# Patient Record
Sex: Female | Born: 1980 | Hispanic: Yes | Marital: Married | Smoking: Never smoker
Health system: Southern US, Community
[De-identification: ages and names within clinical notes are randomized; demographics above are authoritative.]

## PROBLEM LIST (undated history)

## (undated) ENCOUNTER — Inpatient Hospital Stay (HOSPITAL_COMMUNITY): Payer: Self-pay

## (undated) DIAGNOSIS — R112 Nausea with vomiting, unspecified: Secondary | ICD-10-CM

## (undated) DIAGNOSIS — N806 Endometriosis in cutaneous scar: Secondary | ICD-10-CM

## (undated) DIAGNOSIS — Z9889 Other specified postprocedural states: Secondary | ICD-10-CM

## (undated) HISTORY — DX: Endometriosis in cutaneous scar: N80.6

---

## 2007-07-21 ENCOUNTER — Inpatient Hospital Stay (HOSPITAL_COMMUNITY): Admission: AD | Admit: 2007-07-21 | Discharge: 2007-07-26 | Payer: Self-pay | Admitting: Obstetrics & Gynecology

## 2007-07-23 ENCOUNTER — Encounter: Payer: Self-pay | Admitting: Obstetrics & Gynecology

## 2008-06-04 ENCOUNTER — Emergency Department (HOSPITAL_COMMUNITY): Admission: EM | Admit: 2008-06-04 | Discharge: 2008-06-04 | Payer: Self-pay | Admitting: Emergency Medicine

## 2010-09-22 ENCOUNTER — Other Ambulatory Visit (HOSPITAL_COMMUNITY): Payer: Self-pay | Admitting: Obstetrics

## 2010-09-22 DIAGNOSIS — Z362 Encounter for other antenatal screening follow-up: Secondary | ICD-10-CM

## 2010-09-29 ENCOUNTER — Encounter (HOSPITAL_COMMUNITY)
Admission: RE | Admit: 2010-09-29 | Discharge: 2010-09-29 | Disposition: A | Discharge status: Home / Self Care | Payer: Self-pay | Source: Ambulatory Visit | Attending: Obstetrics | Admitting: Obstetrics

## 2010-09-29 DIAGNOSIS — Z01812 Encounter for preprocedural laboratory examination: Secondary | ICD-10-CM | POA: Insufficient documentation

## 2010-09-29 LAB — CBC
HCT: 35.7 % — ABNORMAL LOW (ref 36.0–46.0)
Hemoglobin: 11.8 g/dL — ABNORMAL LOW (ref 12.0–15.0)
MCH: 30.3 pg (ref 26.0–34.0)
MCHC: 33.1 g/dL (ref 30.0–36.0)
MCV: 91.5 fL (ref 78.0–100.0)
RBC: 3.9 MIL/uL (ref 3.87–5.11)

## 2010-09-29 LAB — SURGICAL PCR SCREEN: MRSA, PCR: NEGATIVE

## 2010-09-29 LAB — RPR: RPR Ser Ql: NONREACTIVE

## 2010-09-30 ENCOUNTER — Other Ambulatory Visit (HOSPITAL_COMMUNITY): Payer: Self-pay

## 2010-09-30 ENCOUNTER — Ambulatory Visit (HOSPITAL_COMMUNITY)
Admission: RE | Admit: 2010-09-30 | Discharge: 2010-09-30 | Disposition: A | Discharge status: Home / Self Care | Payer: Self-pay | Source: Ambulatory Visit | Attending: Obstetrics | Admitting: Obstetrics

## 2010-09-30 ENCOUNTER — Ambulatory Visit (HOSPITAL_COMMUNITY)
Admission: RE | Admit: 2010-09-30 | Discharge: 2010-09-30 | Disposition: A | Discharge status: Home / Self Care | Payer: Medicaid Other | Source: Ambulatory Visit | Attending: Obstetrics | Admitting: Obstetrics

## 2010-09-30 DIAGNOSIS — Z8751 Personal history of pre-term labor: Secondary | ICD-10-CM | POA: Insufficient documentation

## 2010-09-30 DIAGNOSIS — Z362 Encounter for other antenatal screening follow-up: Secondary | ICD-10-CM

## 2010-09-30 DIAGNOSIS — O34219 Maternal care for unspecified type scar from previous cesarean delivery: Secondary | ICD-10-CM | POA: Insufficient documentation

## 2010-10-01 ENCOUNTER — Other Ambulatory Visit (HOSPITAL_COMMUNITY): Payer: Self-pay | Admitting: Obstetrics

## 2010-10-01 DIAGNOSIS — O34219 Maternal care for unspecified type scar from previous cesarean delivery: Secondary | ICD-10-CM

## 2010-10-01 DIAGNOSIS — Z8751 Personal history of pre-term labor: Secondary | ICD-10-CM

## 2010-10-07 ENCOUNTER — Inpatient Hospital Stay (HOSPITAL_COMMUNITY)
Admission: RE | Admit: 2010-10-07 | Discharge: 2010-10-10 | DRG: 766 | Disposition: A | Discharge status: Home / Self Care | Payer: Medicaid Other | Source: Ambulatory Visit | Attending: Obstetrics | Admitting: Obstetrics

## 2010-10-07 ENCOUNTER — Ambulatory Visit (HOSPITAL_COMMUNITY): Payer: Self-pay

## 2010-10-07 ENCOUNTER — Ambulatory Visit (HOSPITAL_COMMUNITY)
Admission: RE | Admit: 2010-10-07 | Discharge: 2010-10-07 | Disposition: A | Discharge status: Home / Self Care | Payer: Medicaid Other | Source: Ambulatory Visit | Attending: Obstetrics | Admitting: Obstetrics

## 2010-10-07 ENCOUNTER — Ambulatory Visit (HOSPITAL_COMMUNITY): Admission: RE | Admit: 2010-10-07 | Payer: Medicaid Other | Source: Ambulatory Visit

## 2010-10-07 DIAGNOSIS — O479 False labor, unspecified: Secondary | ICD-10-CM | POA: Insufficient documentation

## 2010-10-07 DIAGNOSIS — O34219 Maternal care for unspecified type scar from previous cesarean delivery: Secondary | ICD-10-CM

## 2010-10-07 DIAGNOSIS — Z8751 Personal history of pre-term labor: Secondary | ICD-10-CM

## 2010-10-07 LAB — CBC
HCT: 37.9 % (ref 36.0–46.0)
MCH: 30.8 pg (ref 26.0–34.0)
MCHC: 33.8 g/dL (ref 30.0–36.0)
MCV: 91.1 fL (ref 78.0–100.0)
Platelets: 121 10*3/uL — ABNORMAL LOW (ref 150–400)
RBC: 4.16 MIL/uL (ref 3.87–5.11)
RDW: 13.9 % (ref 11.5–15.5)
WBC: 8.3 10*3/uL (ref 4.0–10.5)

## 2010-10-07 LAB — TYPE AND SCREEN

## 2010-10-08 LAB — CBC
MCH: 30.3 pg (ref 26.0–34.0)
MCHC: 33.1 g/dL (ref 30.0–36.0)
RBC: 3.33 MIL/uL — ABNORMAL LOW (ref 3.87–5.11)
RDW: 13.9 % (ref 11.5–15.5)

## 2010-10-13 ENCOUNTER — Other Ambulatory Visit (HOSPITAL_COMMUNITY): Payer: Self-pay

## 2010-11-03 ENCOUNTER — Inpatient Hospital Stay (HOSPITAL_COMMUNITY)
Admission: AD | Admit: 2010-11-03 | Discharge: 2010-11-03 | Disposition: A | Discharge status: Home / Self Care | Payer: Medicaid Other | Source: Ambulatory Visit | Attending: Obstetrics | Admitting: Obstetrics

## 2010-11-03 DIAGNOSIS — O909 Complication of the puerperium, unspecified: Secondary | ICD-10-CM | POA: Insufficient documentation

## 2010-11-04 NOTE — Op Note (Addendum)
  NAMESHARLEEN, [name]        ACCOUNT NO.:  [id number]  MEDICAL RECORD NO.:  [id number]           PATIENT TYPE:  O  LOCATION:  MFM                           F[facility]  [facility]  PHYSICIAN:  Kathreen Cosier, M.D.DATE OF BIRTH:  09-03-1980  DATE OF PROCEDURE:  10/07/2010 DATE OF DISCHARGE:                              OPERATIVE REPORT   PREOPERATIVE DIAGNOSIS:  Previous classical cesarean section at 29 weeks, now at 38 plus weeks in early labor for repeat cesarean section.  POSTOPERATIVE DIAGNOSIS:  Previous classical cesarean section at 29 weeks, now at 38 plus weeks in early labor for repeat cesarean section.  SURGEON:  Kathreen Cosier, MD  FIRST ASSISTANT:  Charles A. Clearance Coots, MD  DESCRIPTION OF PROCEDURE:  The patient placed on the operating table in supine position after spinal administered.  Abdomen prepped and draped. Bladder emptied with a Foley catheter.  Transverse suprapubic incision was made, carried down to the rectus fascia.  Fascia cleanly incised to the length of the incision.  Recti muscles retracted laterally. Peritoneum incised longitudinally.  It was noted she had a T incision on her uterus and the uterine wall was very thin.  A transverse lower uterine incision made.  Fluid was clear.  The patient delivered from the LOA position of a female, Apgars 7 and 9, weighing 6 pounds and 12 ounces.  Placenta was posterior, removed manually, and sent to Labor and Delivery.  Uterine cavity cleaned with dry laps.  Uterine incision closed in 1 layer with continuous suture of #1 chromic.  Hemostasis satisfactory.  Bladder flap reattached with 2-0 chromic.  Uterus well contracted.  Tubes and ovaries normal.  Abdomen closed in layers, peritoneum with continuous suture of 0 chromic, fascia with continuous suture of 0 Dexon, and the skin closed with subcuticular stitch of 4-0 Monocryl.  Blood loss 700 mL.  The patient tolerated procedure well, taken to recovery room in  good condition.          ______________________________ Kathreen Cosier, M.D.     BAM/MEDQ  D:  10/07/2010  T:  10/08/2010  Job:  [id number]  Electronically Signed by Francoise Ceo M.D. on 10/14/2010 08:27:08 AM

## 2010-11-04 NOTE — Discharge Summary (Signed)
  [name], [name]        ACCOUNT NO.:  [id number]  MEDICAL RECORD NO.:  [id number]           PATIENT TYPE:  I  LOCATION:  9146                          [facility]ACILITY:  [facility]  PHYSICIAN:  Roseanna Rainbow, M.D.DATE OF BIRTH:  [birth year]  DATE OF ADMISSION:  10/07/2010 DATE OF DISCHARGE:  10/10/2010                              DISCHARGE SUMMARY   CHIEF COMPLAINT:  The patient is a 30 year old gravida 2, para 0-1-0-1 with a history of previous classical cesarean delivery, for a repeat cesarean delivery.  HISTORY OF PRESENT ILLNESS:  The patient had presented to the maternal fetal medicine department for a scheduled amniocentesis.  She was found to be in early labor.  The recommendation was to proceed with a cesarean delivery.  PHYSICAL EXAMINATION:  HEAD, EYES, EARS, NOSE, AND THROAT: Normocephalic, atraumatic. NECK:  No masses, nontender. LUNGS:  Clear to auscultation bilaterally. HEART:  Regular rate and rhythm.  No murmurs, rubs, or gallops. ABDOMEN:  Term. EXTREMITIES:  No clubbing, cyanosis, or edema. PELVIC:  The cervix is long and closed.  ASSESSMENT:  Intrauterine pregnancy at 37 weeks, history of a previous classical cesarean delivery with threatened labor.  PLAN:  Admission, repeat cesarean delivery.  HOSPITAL COURSE:  The patient was admitted and underwent a repeat cesarean delivery.  Please see the dictated operative summary for findings.  On postoperative day #1, hemoglobin was 10.1.  The remainder of her hospital course was uneventful.  She received a Depo-Provera prior to discharge.  DISCHARGE DIAGNOSIS:  Intrauterine pregnancy at 37 weeks, history of previous classical cesarean delivery, threatened labor.  PROCEDURE:  Repeat cesarean delivery.  CONDITION:  Stable.  DIET:  Regular.  ACTIVITY:  Pelvic rest, progressive activity.  MEDICATIONS:  Percocet 5/325 one to two tablets every 6 hours as needed.  DISPOSITION:  The patient was to follow  up in the office with Dr. Gaynell Face in 2 weeks.     Roseanna Rainbow, M.D.     [name]  D:  10/30/2010  T:  10/31/2010  Job:  [id number]  cc:   Kathreen Cosier, M.D. Fax: [phone]  Electronically Signed by Antionette Char M.D. on 11/04/2010 09:52:05 PM

## 2010-11-06 LAB — WOUND CULTURE

## 2010-11-07 ENCOUNTER — Inpatient Hospital Stay (HOSPITAL_COMMUNITY)
Admission: AD | Admit: 2010-11-07 | Discharge: 2010-11-07 | Disposition: A | Discharge status: Home / Self Care | Payer: Medicaid Other | Source: Ambulatory Visit | Attending: Obstetrics | Admitting: Obstetrics

## 2010-11-07 DIAGNOSIS — O909 Complication of the puerperium, unspecified: Secondary | ICD-10-CM | POA: Insufficient documentation

## 2010-12-15 ENCOUNTER — Inpatient Hospital Stay (HOSPITAL_COMMUNITY): Admission: AD | Admit: 2010-12-15 | Payer: Self-pay | Source: Home / Self Care | Admitting: Obstetrics

## 2010-12-29 NOTE — Op Note (Signed)
[name], [name]                ACCOUNT NO.:  [id number]   MEDICAL RECORD NO.:  [id number]          PATIENT TYPE:  INP   LOCATION:  9149                          [facility]ILITY[facility]  [facility]   PHYSICIAN:  Roseanna Rainbow, M.D.DATE OF BIRTH:  1981-05-03   DATE OF PROCEDURE:  07/23/2007  DATE OF DISCHARGE:                               OPERATIVE REPORT   PREOPERATIVE DIAGNOSES:  1. Intrauterine pregnancy at 29+ weeks.  2. Rule out abruptio placentae.  3. Oligohydramnios.  4. Suspicious fetal heart tracing with late decelerations.  5. Biophysical profile 2/8.   POSTOPERATIVE DIAGNOSES:  1. Intrauterine pregnancy at 29+ weeks.  2. Rule out abruptio placentae.  3. Oligohydramnios.  4. Suspicious fetal heart tracing with late decelerations.  5. Biophysical profile 2/8.   PROCEDURE:  Primary classical cesarean delivery via Pfannenstiel skin  incision.   SURGEON:  Roseanna Rainbow, M.D.   ANESTHESIA:  Spinal.   PATHOLOGY:  Placenta.   ESTIMATED BLOOD LOSS:  600 mL.   FINDINGS:  The membranes were tinged with a yellow pigment.  The  membranes that were removed from the uterine wall after the placenta had  been removed were necrotic and again tinged with a yellow pigment.  An  umbilical artery pH was sent and was 7.13.   PROCEDURE:  The patient was taken to the operating room with an IV  running.  A spinal anesthetic was then administered.  She was then  placed in the dorsal supine position with a leftward tilt and prepped  and draped in the usual sterile fashion.  A Pfannenstiel skin incision  was then made with a scalpel and carried down to the underlying fascia.  The fascia was nicked in the midline.  The fascial incision was then  extended bilaterally.  The superior aspect of the fascial incision was  tented up and the underlying rectus muscles dissected off.  The inferior  aspect of the fascial incision was then manipulated in a similar  fashion.  The rectus muscles were  separated in the midline.  The  parietal peritoneum was tented up and entered sharply.  This incision  was then extended superiorly and inferiorly with good visualization of  the bladder.  The bladder blade was then placed.  The vesicouterine  peritoneum was tented up and entered sharply.  This incision was then  extended bilaterally and the bladder flap created bluntly.  At this  point, the lower uterine segment was not developed and the decision was  made to proceed with a midline vertical uterine incision.  This incision  was started in the lower uterine segment and extended superiorly to the  fundus.  The infant was then delivered en caul.  The cord was clamped  and cut.  The infant was handed over to the awaiting neonatologist.  An  umbilical artery pH was sent; please see the above.  The placenta was  then removed.  The intrauterine cavity was evacuated of any remaining  amniotic fluid, clots and debris with a moistened laparotomy sponge.  The uterus was exteriorized.  The uterine incision was then  reapproximated in 2 layers of running interlocking sutures of 0  Monocryl.  Seprafilm was then placed over the over the uterine closure.  The abdomen was irrigated.  The uterus was returned to the abdomen.  The  parietal peritoneum was reapproximated in a running fashion using 2-0  Vicryl.  The fascia was closed in a running fashion using 0 Vicryl.  The  skin was closed with staples.  At the closure of the procedure, the  instrument and pack counts were said to be correct x2.  The patient was  taken to the PACU, awake and in stable condition.  A gram of cephazolin  had been given at cord clamp.      Roseanna Rainbow, M.D.  Electronically Signed     LAJ/MEDQ  D:  07/23/2007  T:  07/24/2007  Job:  [id number]

## 2011-01-01 NOTE — Discharge Summary (Signed)
[name], [name]                ACCOUNT NO.:  [id number]   MEDICAL RECORD NO.:  [id number]          PATIENT TYPE:  INP   LOCATION:  9309                          [facility]   PHYSICIAN:  Roseanna Rainbow, M.D.DATE OF BIRTH:  [birth year]   DATE OF ADMISSION:  07/21/2007  DATE OF DISCHARGE:  07/26/2007                               DISCHARGE SUMMARY   CHIEF COMPLAINT:  The patient is a 30 year old para 0 with an estimated  date of confinement of October 04, 2007 with an intrauterine pregnancy  at 29+ weeks complaining of vaginal bleeding and cramping.   HISTORY OF PRESENT ILLNESS:  Please see the above.  She denies vaginal  discharge, recent coitus, trauma, tobacco or drug use or spontaneous  rupture of membranes.  The pregnancy has been uncomplicated to date.   ALLERGIES:  NO KNOWN DRUG ALLERGIES.   MEDICATIONS:  See the reconciliation form.   PRENATAL LABS:  Urine culture and sensitivity; no uterine pathogens.  GC  probe negative, chlamydia probe negative, HIV nonreactive.  Pap smear  negative, rubella immune, RPR nonreactive, sickle cell negative,  hemoglobin 12.2, platelets 152,000, hepatitis B surface antigen  negative, blood type B+, antibody screen negative, varicella immune.   PAST GYN HISTORY:  She denies.   PAST MEDICAL HISTORY:  She denies.   PAST SURGICAL HISTORY:  She denies.   SOCIAL HISTORY:  Unemployed, single.  No tobacco, ethanol or drug use.   FAMILY HISTORY:  Adult onset diabetes mellitus.   PHYSICAL EXAM:  Vital signs stable, afebrile.  Fetal heart tracing 140  with accelerations, occasional moderate prolonged variable  decelerations.  Tocodynamometer; irregular uterine contractions.  Speculum speculum exam:  Scant old heme, no pooling.  Fern negative, GC  and chlamydia DNA probes were sent.  Ultrasound;  estimated fetal weight  at the 27th percentile, AFI 4.9.  Cervix greater than 3 cm in length.  Urinalysis negative.   ASSESSMENT:   Intrauterine pregnancy at 29+ weeks, oligohydramnios,  suspicious fetal heart tracing.  Doubt PPROM.  Questionable marginal  placental separation.  She is Rh positive.   PLAN:  Admission.  Continuous fetal heart monitoring, steroids, empiric  antibiotics.  Maternal fetal medicine consult on July 23, 2007 at  approximately 9:00 a.m.  Call to see the patient with a suspicious fetal  heart tracing.  Upon review there were repetitive variable versus late  decelerations and there were no accelerations.  The patient reported  decreased fetal movement.  A biophysical profile was 2/8 at which point  the decision was made to proceed with a cesarean delivery for  nonreassuring antenatal testing.  Please see the dictated operative  summary.  On postoperative day #1 her hemoglobin was 8.8.  This was down  from 11.6.  She was hemodynamically stable.  On postoperative day #3 the  events of the surgery were reviewed i.e. that the patient had undergone  a classical cesarean delivery.  She was counseled against any further  vaginal delivery attempts with future pregnancies.  An interpreter was  present and all of her questions were answered.  A thrombophilia  workup  was performed.  She received Depo-Provera prior to discharge.   DISCHARGE DIAGNOSES:  1. Likely placental abruption at 29+ weeks.  2. Suspicious fetal heart tracing and biophysical profile.   PROCEDURE:  Classical cesarean delivery.   CONDITION:  Stable.   DIET:  Regular.   ACTIVITY:  Pelvic rest, progressive activity.   MEDICATIONS:  1. Percocet.  2. Ibuprofen.  3. Prenatal vitamins.  4. Ferrous sulfate.   DISPOSITION:  The patient was to follow up in the office in 2 weeks.      Roseanna Rainbow, M.D.  Electronically Signed     LAJ/MEDQ  D:  08/10/2007  T:  08/10/2007  Job:  [id number]

## 2011-01-08 NOTE — Discharge Summary (Signed)
  NAMEAMBERLIN, UTKE        ACCOUNT NO.:  [id number]  MEDICAL RECORD NO.:  [id number]           PATIENT TYPE:  O  LOCATION:  MFM                           [facility]  PHYSICIAN:  Roseanna Rainbow, M.D.DATE OF BIRTH:  Nov 05, 1980  DATE OF ADMISSION:  10/07/2010 DATE OF DISCHARGE:  10/10/2010                              DISCHARGE SUMMARY   CHIEF COMPLAINT:  The patient is a 30 year old gravida 2, para 0-1-0-1 with an estimated date of confinement of October 25, 2010, with a history of previous classical cesarean delivery now for a repeat cesarean delivery.    HISTORY OF PRESENT ILLNESS:  The patient had presented to the maternal fetal medicine office on the day of admission for as scheduled amniocentesis.  She was found to be in early labor.  PHYSICAL EXAMINATION:  HEAD, EYES, EARS, NOSE, AND THROAT: Normocephalic, atraumatic. LUNGS:  Clear to auscultation bilaterally. HEART:  Regular rate and rhythm.  No murmurs, rubs, or gallops. ABDOMEN:  Term. EXTREMITIES:  No clubbing, cyanosis, or edema. PELVIC:  The cervix is long and closed.  ASSESSMENT:  Intrauterine pregnancy at term, history of a previous classical cesarean delivery with threatened labor.  PLAN:  Admission, repeat cesarean delivery.  HOSPITAL COURSE:  The patient was admitted and underwent a cesarean delivery.  Please see the dictated operative summary for findings.  On postoperative day 1, a hemoglobin was 10.1.  The remainder of her hospital course was uneventful.  She received a Depo-Provera injection prior to discharge.  DISCHARGE DIAGNOSES:  Intrauterine pregnancy at term, history of previous classical cesarean delivery, threatened labor.  PROCEDURE:  Repeat cesarean delivery.  CONDITION:  Stable.  DIET:  Regular.  ACTIVITY:  Pelvic rest, progressive activity.  MEDICATIONS: 1. Prenatal vitamins. 2. Percocet 5/325 one to two tablets every 6 hours as needed.  DISPOSITION:  The patient  was to follow up with Dr. Gaynell Face.     Roseanna Rainbow, M.D.     Judee Clara  D:  10/10/2010  T:  10/10/2010  Job:  [id number]  Electronically Signed by Antionette Char M.D. on 10/20/2010 07:36:58 PM

## 2011-05-18 LAB — URINALYSIS, ROUTINE W REFLEX MICROSCOPIC
Glucose, UA: NEGATIVE
Hgb urine dipstick: NEGATIVE
Ketones, ur: NEGATIVE
Specific Gravity, Urine: 1.026
Urobilinogen, UA: 1

## 2011-05-18 LAB — URINE CULTURE: Colony Count: 40000

## 2011-05-18 LAB — URINE MICROSCOPIC-ADD ON

## 2011-05-18 LAB — POCT PREGNANCY, URINE: Preg Test, Ur: NEGATIVE

## 2011-05-24 LAB — URINALYSIS, ROUTINE W REFLEX MICROSCOPIC
Glucose, UA: NEGATIVE
Ketones, ur: 15 — AB
Specific Gravity, Urine: 1.02
Urobilinogen, UA: 0.2

## 2011-05-24 LAB — CBC
Hemoglobin: 11.6 — ABNORMAL LOW
Hemoglobin: 8.8 — ABNORMAL LOW
MCHC: 34.8
MCHC: 35.4
MCV: 92.9
MCV: 93.7
RBC: 2.69 — ABNORMAL LOW
RBC: 3.53 — ABNORMAL LOW

## 2011-05-24 LAB — ANAEROBIC CULTURE: Gram Stain: NONE SEEN

## 2011-05-24 LAB — PROTEIN S, TOTAL: Protein S Ag, Total: 78 % (ref 70–140)

## 2011-05-24 LAB — LUPUS ANTICOAGULANT PANEL
Lupus Anticoagulant: NOT DETECTED
PTTLA 4:1 Mix: 47.1 (ref 36.3–48.8)

## 2011-05-24 LAB — PROTHROMBIN GENE MUTATION

## 2011-05-24 LAB — FACTOR 7 ASSAY: Factor VII Activity: 116 % (ref 83–181)

## 2011-05-24 LAB — FOLATE: Folate: 18.7

## 2011-10-13 ENCOUNTER — Inpatient Hospital Stay (HOSPITAL_COMMUNITY)
Admission: AD | Admit: 2011-10-13 | Discharge: 2011-10-13 | Disposition: A | Discharge status: Home / Self Care | Payer: Self-pay | Source: Ambulatory Visit | Attending: Obstetrics and Gynecology | Admitting: Obstetrics and Gynecology

## 2011-10-13 ENCOUNTER — Encounter (HOSPITAL_COMMUNITY): Payer: Self-pay | Admitting: *Deleted

## 2011-10-13 DIAGNOSIS — N809 Endometriosis, unspecified: Secondary | ICD-10-CM

## 2011-10-13 DIAGNOSIS — O909 Complication of the puerperium, unspecified: Secondary | ICD-10-CM | POA: Insufficient documentation

## 2011-10-13 DIAGNOSIS — R109 Unspecified abdominal pain: Secondary | ICD-10-CM | POA: Insufficient documentation

## 2011-10-13 MED ORDER — IBUPROFEN 800 MG PO TABS: 800.0000 mg | ORAL_TABLET | Freq: Three times a day (TID) | ORAL | Status: AC | PRN

## 2011-10-13 NOTE — Progress Notes (Signed)
Pt presents to [facility] for pain in her incision site.  She has been here twice.  Started swelling today.

## 2011-10-13 NOTE — ED Provider Notes (Signed)
History     Chief Complaint  Patient presents with  . Abdominal Pain   HPI 31 y.o. [id number] c/o pain at left end of c/s incision scar since time of c/s, February 2012, worsening over the last 2 months, feels that it is swelling, worsens with menses. Was treated for infection at the [facility] 20+ days ago with a 5 day course of antibiotics, no change.    Past Medical History  Diagnosis Date  . No pertinent past medical history     Past Surgical History  Procedure Date  . Cesarean section     Family History  Problem Relation Age of Onset  . Diabetes Mother   . Cancer Brother     History  Substance Use Topics  . Smoking status: Never Smoker   . Smokeless tobacco: Not on file  . Alcohol Use: No    Allergies: No Known Allergies  Prescriptions prior to admission  Medication Sig Dispense Refill  . acetaminophen (TYLENOL) 325 MG tablet Take 325 mg by mouth every 6 (six) hours as needed. For pain        Review of Systems  Constitutional: Negative.   Respiratory: Negative.   Cardiovascular: Negative.   Gastrointestinal: Positive for abdominal pain. Negative for nausea, vomiting, diarrhea and constipation.  Genitourinary: Negative for dysuria, urgency, frequency, hematuria and flank pain.       Negative for vaginal bleeding, vaginal discharge  Musculoskeletal: Negative.   Neurological: Negative.   Psychiatric/Behavioral: Negative.    Physical Exam   Blood pressure 110/63, pulse 71, last menstrual period 10/04/2011.  Physical Exam  Nursing note and vitals reviewed. Constitutional: She is oriented to person, place, and time. She appears well-developed and well-nourished. No distress.  Cardiovascular: Normal rate and regular rhythm.   Respiratory: Effort normal. No respiratory distress.  GI: Soft. She exhibits mass (small palpable mass under left end of c/s scar). She exhibits no distension. There is tenderness (along c/s scar, greatest at left end). There is no rebound  and no guarding.  Musculoskeletal: Normal range of motion.  Neurological: She is alert and oriented to person, place, and time.  Skin: Skin is warm and dry.  Psychiatric: She has a normal mood and affect.    MAU Course  Procedures  Dr. Emelda Fear to MAU to evaluate - diagnosed as endometrioma at c/s incision site, recommend outpatient surgical follow up  Assessment and Plan  31 y.o. [id number] with c/s scar endometrioma Pt given resources for outpatient follow up Motrin for pain  Storm Dulski 10/13/2011, 10:43 PM

## 2011-10-13 NOTE — Progress Notes (Signed)
Pt LMP 10/04/2011, having burning at C/S site x 2 months.  C/S 10/07/2010.

## 2011-10-13 NOTE — Progress Notes (Signed)
Dr. Emelda Fear at bedside to assess pt.

## 2011-10-13 NOTE — Discharge Instructions (Signed)
You have endometrial tissue (cells from the inside of the uterus) in the scar from your c-section. This will need to be surgically removed. You can try to above options to look for doctors to help you with this.

## 2011-10-24 NOTE — ED Provider Notes (Signed)
Patient Examined in coordination with Ms Bascom Levels.  Scar has become progressively more tender, partiularly with menses, since delivery. No erythema. No purulence for fluctuance.  Exam and History consistent with endometrioma. Patient given info on available clinic and Surgical consultants options.

## 2011-11-07 ENCOUNTER — Encounter (HOSPITAL_COMMUNITY): Payer: Self-pay | Admitting: *Deleted

## 2011-11-07 ENCOUNTER — Emergency Department (HOSPITAL_COMMUNITY)
Admission: EM | Admit: 2011-11-07 | Discharge: 2011-11-08 | Disposition: A | Discharge status: Home / Self Care | Payer: Self-pay | Attending: Emergency Medicine | Admitting: Emergency Medicine

## 2011-11-07 DIAGNOSIS — R102 Pelvic and perineal pain: Secondary | ICD-10-CM

## 2011-11-07 DIAGNOSIS — N949 Unspecified condition associated with female genital organs and menstrual cycle: Secondary | ICD-10-CM | POA: Insufficient documentation

## 2011-11-07 DIAGNOSIS — R109 Unspecified abdominal pain: Secondary | ICD-10-CM | POA: Insufficient documentation

## 2011-11-07 NOTE — ED Notes (Signed)
Pt has pain in her c-section scar and was told that she needs to have a revision because she has endometrial cells in her c-section scar.  Pt is here because the pain is increased.

## 2011-11-07 NOTE — ED Notes (Signed)
Pt remains in waiting room.  No changes.  Updated on wait for room.

## 2011-11-08 LAB — URINALYSIS, ROUTINE W REFLEX MICROSCOPIC
Bilirubin Urine: NEGATIVE
Glucose, UA: NEGATIVE mg/dL
Hgb urine dipstick: NEGATIVE
Ketones, ur: NEGATIVE mg/dL
Leukocytes, UA: NEGATIVE
Nitrite: NEGATIVE
Protein, ur: NEGATIVE mg/dL
Specific Gravity, Urine: 1.022 (ref 1.005–1.030)
Urobilinogen, UA: 0.2 mg/dL (ref 0.0–1.0)
pH: 6.5 (ref 5.0–8.0)

## 2011-11-08 MED ORDER — IBUPROFEN 400 MG PO TABS: 400.0000 mg | ORAL_TABLET | Freq: Four times a day (QID) | ORAL | Status: DC | PRN

## 2011-11-08 MED ORDER — DIAZEPAM 5 MG PO TABS
5.0000 mg | ORAL_TABLET | Freq: Once | ORAL | Status: AC
  Administered 2011-11-08: 5 mg via ORAL
  Filled 2011-11-08: qty 1

## 2011-11-08 MED ORDER — IBUPROFEN 800 MG PO TABS
800.0000 mg | ORAL_TABLET | Freq: Once | ORAL | Status: AC
  Administered 2011-11-08: 800 mg via ORAL
  Filled 2011-11-08: qty 1

## 2011-11-08 MED ORDER — OXYCODONE-ACETAMINOPHEN 5-325 MG PO TABS
1.0000 | ORAL_TABLET | Freq: Once | ORAL | Status: AC
  Administered 2011-11-08: 1 via ORAL
  Filled 2011-11-08: qty 1

## 2011-11-08 MED ORDER — HYDROCODONE-ACETAMINOPHEN 5-325 MG PO TABS: 2.0000 | ORAL_TABLET | ORAL | Status: DC | PRN

## 2011-11-08 NOTE — Discharge Instructions (Signed)
Please read over the instructions below. You were seen in the emergency department tonight for your ongoing lower abdominal pain associated with your C-section scar. We have offered to do a pelvic exam here to assure that there are no new process contributing to your pain, but you have declined a pelvic exam. Your urine test was completely clear and normal. You are not pregnant. You'll need to either return to Dr. Gaynell Face or to the physician at [facility] who indicated that you may need surgery on your C-section scar to alleviate your pain. The [facility] would only be a place to go if you were sent there by a surgeon.until you can followup we have prescribed a short course of medication for pain and some anti-inflammatory medication that may help alleviate some of your pain. We recommend that you return to [facility] for further evaluation and to obtain further information regarding how you go about getting the surgery that they have recommended.      Lea rpidamente por favor las instrucciones abajo. Fue visto en el departamento de emergencia esta noche para su dolor abdominal, ms bajo y progresivo asociado con la cicatriz de C-SECCION. Hemos ofrecido hacer un examen plvico para aqu asegurarse de que no hay nuevo proceso que contribuye a Agricultural consultant, pero ha disminuido un examen plvico. Su prueba de la orina fue completamente clara y normal. Usted no est embarazada. Necesitar o regresar al Dr. Gaynell Face o al [facility] [place] que indic que puede necesitar la ciruga en la cicatriz de C-SECCION para Teacher, early years/pre. El Cono de [employer] Quirrgico slo sera un lugar de ir si fue enviado all por un cirujano.hasta que usted puede followup que hemos prescrito un curso corto de medicina para dolor y Land antiinflamatoria que pueden ayudar a Paramedic algn de su dolor. Recomendamos que regresa al [facility] [place] para la evaluacin adicional y  para obtener informacin adicional con respecto a cmo va acerca de conseguir la ciruga que han recomendado.     Dolor abdominal en las mujeres (Abdominal Pain, Women) El dolor abdominal (en el estmago, la pelvis o el vientre) puede tener muchas causas. Es importante que le informe a su mdico:  La ubicacin del Engineer, mining.   Viene y se va, o persiste todo el tiempo?   Hay situaciones que Location manager (comer ciertos alimentos, la actividad fsica)?   Tiene otros sntomas asociados al dolor (fiebre, nuseas, vmitos, diarrea)?  Todo es de gran ayuda cuando se trata de hallar la causa del dolor. CAUSAS  Estmago: Infecciones por virus o bacterias, o lcera.   Intestino: Apendicitis (apndice inflamado), ileitis regional (enfermedad de Crohn), colitis ulcerosa (colon inflamado), sndrome del colon irritable, diverticulitis (inflamacin de los divertculos del colon) o cncer de estmago oo intestino.   Enfermedades de la vescula biliar o clculos.   Enfermedades renales, clculos o infecciones en el rin.   Infeccin o cncer del pncreas.   Fibromialgia (trastorno doloroso)   Enfermedades de los rganos femeninos:   Uterus: tero: fibroma (tumor no canceroso) o infeccin   Trompas de Falopio: infeccin o embarazo ectpico   En los ovarios, quistes o tumores.   Adherencias plvicas (tejido cicatrizal).   Endometriosis (el tejido que cubre el tero se desarrolla en la pelvis y los rganos plvicos).   Sndrome de Agricultural engineer (los rganos femeninos se llenan de sangre antes del periodo menstrual(   Dolor durante el periodo menstrual.   Dolor  durante la ovulacin (al producir vulos).   Dolor al usar el DIU (dispositivo intrauterino para el control de la natalidad)   Psychologist, clinical los rganos femeninos.   Dolor funcional (no est originado en una enfermedad, puede mejorar sin tratamiento).   Dolor de origen psicolgico   Depresin.  DIAGNSTICO Su mdico  decidir la gravedad del dolor a travs del examen fsico  Anlisis de sangre   Radiografas   Ecografas   TC (tomografa computada, tipo especial de radiografas).   IMR (resonancia magntica)   Cultivos, en el caso una infeccin   Colon por enema de bario (se inserta una sustancia de contraste en el intestino grueso para mejorar la observacin con rayos X.)   Colonoscopa (observacin del intestino con un tubo luminoso).   Laparoscopa (examen del interior del abdomen con un tubo que tiene [employer]).   Ciruga exploratoria abdominal mayor (se observa el abdomen realizando una gran incisin).  TRATAMIENTO El tratamiento depender de la causa del problema.   Muchos de estos casos pueden controlarse y tratarse en casa.   Medicamentos de venta libre indicados por el mdico.   Medicamentos con receta.   Antibiticos, en caso de infeccin   Pldoras anticonceptivas, en el caso de perodos dolorosos o dolor al ovular.   Tratamiento hormonal, para la endometriosis   Inyecciones para bloqueo nervioso selectivo.   Fisioterapia.   Antidepresivos.   Consejos por parte de un psclogo o psiquiatra.   Ciruga mayor o menor.  INSTRUCCIONES PARA EL CUIDADO DOMICILIARIO  No tome ni administre laxantes a menos que se lo haya indicado su mdico.   Tome analgsicos de venta libre slo si se lo ha indicado el profesional que lo asiste. No tome aspirina, ya que puede causar [employer] o hemorragias.   Consuma una dieta lquida (caldo o agua) segn lo indicado por el mdico. Progrese lentamente a una dieta blanda, segn la tolerancia, si el dolor se relaciona con el estmago o el intestino.   Tenga un termmetro y tmese la temperatura varias veces al da.   Haga reposo en la cama y [place], si esto Research scientist (life sciences).   Evite las relaciones sexuales, Counsellor.   Evite las situaciones estresantes.   Cumpla con las visitas y los anlisis de control, segn  las indicaciones de su mdico.   Si el dolor no se [place] con los medicamentos o la [place], [place] tratar con:   Acupuntura.   Ejercicios de relajacin (yoga, meditacin).   Terapia grupal.   Psicoterapia.  SOLICITE ATENCIN MDICA SI:  Nota que ciertos Pharmacist, community de [place].   El tratamiento indicado para Arboriculturist no Marketing executive.   Necesita analgsicos ms fuertes.   Quiere que le retiren el DIU.   Si se siente confundido o desfalleciente.   Presenta nuseas o vmitos.   Aparece una erupcin cutnea.   Sufre efectos adversos o una reaccin alrgica debido a los medicamentos que toma.  SOLICITE ATENCIN MDICA DE INMEDIATO SI:  El dolor persiste o se agrava.   Tiene fiebre.   Siente el dolor slo en algunos sectores del abdomen. Si se localiza en la zona derecha, posiblemente podra tratarse de apendicitis. En un adulto, si se localiza en la regin inferior izquierda del abdomen, podra tratarse de colitis o diverticulitis.   Hay sangre en las heces (deposiciones de color rojo brillante o negro alquitranado), con o sin vmitos.   Usted presenta [employer]  en la orina.   Siente escalofros con o sin fiebre.   Se desmaya.  ASEGRESE QUE:   Comprende estas instrucciones.   Controlar su enfermedad.   Solicitar ayuda de inmediato si no mejora o si empeora.  Document Released: 11/18/2008 Document Revised: 07/22/2011 [facility] Patient Information 2012 [place], [place].Dolor abdominal en las mujeres (Abdominal Pain, Women) El dolor abdominal (en el estmago, la pelvis o el vientre) puede tener muchas causas. Es importante que le informe a su mdico:  La ubicacin del Engineer, mining.   Viene y se va, o persiste todo el tiempo?   Hay situaciones que Location manager (comer ciertos alimentos, la actividad fsica)?   Tiene otros sntomas asociados al dolor (fiebre, nuseas, vmitos, diarrea)?  Todo es de gran ayuda cuando se trata de hallar  la causa del dolor. CAUSAS  Estmago: Infecciones por virus o bacterias, o lcera.   Intestino: Apendicitis (apndice inflamado), ileitis regional (enfermedad de Crohn), colitis ulcerosa (colon inflamado), sndrome del colon irritable, diverticulitis (inflamacin de los divertculos del colon) o cncer de estmago oo intestino.   Enfermedades de la vescula biliar o clculos.   Enfermedades renales, clculos o infecciones en el rin.   Infeccin o cncer del pncreas.   Fibromialgia (trastorno doloroso)   Enfermedades de los rganos femeninos:   Uterus: tero: fibroma (tumor no canceroso) o infeccin   Trompas de Falopio: infeccin o embarazo ectpico   En los ovarios, quistes o tumores.   Adherencias plvicas (tejido cicatrizal).   Endometriosis (el tejido que cubre el tero se desarrolla en la pelvis y los rganos plvicos).   Sndrome de Agricultural engineer (los rganos femeninos se llenan de sangre antes del periodo menstrual(   Dolor durante el periodo menstrual.   Dolor durante la ovulacin (al producir vulos).   Dolor al usar el DIU (dispositivo intrauterino para el control de la natalidad)   Psychologist, clinical los rganos femeninos.   Dolor funcional (no est originado en una enfermedad, puede mejorar sin tratamiento).   Dolor de origen psicolgico   Depresin.  DIAGNSTICO Su mdico decidir la gravedad del dolor a travs del examen fsico  Anlisis de sangre   Radiografas   Ecografas   TC (tomografa computada, tipo especial de radiografas).   IMR (resonancia magntica)   Cultivos, en el caso una infeccin   Colon por enema de bario (se inserta una sustancia de contraste en el intestino grueso para mejorar la observacin con rayos X.)   Colonoscopa (observacin del intestino con un tubo luminoso).   Laparoscopa (examen del interior del abdomen con un tubo que tiene [employer]).   Ciruga exploratoria abdominal mayor (se observa el abdomen realizando  una gran incisin).  TRATAMIENTO El tratamiento depender de la causa del problema.   Muchos de estos casos pueden controlarse y tratarse en casa.   Medicamentos de venta libre indicados por el mdico.   Medicamentos con receta.   Antibiticos, en caso de infeccin   Pldoras anticonceptivas, en el caso de perodos dolorosos o dolor al ovular.   Tratamiento hormonal, para la endometriosis   Inyecciones para bloqueo nervioso selectivo.   Fisioterapia.   Antidepresivos.   Consejos por parte de un psclogo o psiquiatra.   Ciruga mayor o menor.  INSTRUCCIONES PARA EL CUIDADO DOMICILIARIO  No tome ni administre laxantes a menos que se lo haya indicado su mdico.   Tome analgsicos de venta libre slo si se lo ha indicado el profesional que lo asiste. No tome aspirina, ya que puede causar  molestias en el estmago o hemorragias.   Consuma una dieta lquida (caldo o agua) segn lo indicado por el mdico. Progrese lentamente a una dieta blanda, segn la tolerancia, si el dolor se relaciona con el estmago o el intestino.   Tenga un termmetro y tmese la temperatura varias veces al da.   Haga reposo en la cama y [place], si esto Research scientist (life sciences).   Evite las relaciones sexuales, Counsellor.   Evite las situaciones estresantes.   Cumpla con las visitas y los anlisis de control, segn las indicaciones de su mdico.   Si el dolor no se [place] con los medicamentos o la [place], [place] tratar con:   Acupuntura.   Ejercicios de relajacin (yoga, meditacin).   Terapia grupal.   Psicoterapia.  SOLICITE ATENCIN MDICA SI:  Nota que ciertos Pharmacist, community de [place].   El tratamiento indicado para Arboriculturist no Marketing executive.   Necesita analgsicos ms fuertes.   Quiere que le retiren el DIU.   Si se siente confundido o desfalleciente.   Presenta nuseas o vmitos.   Aparece una erupcin cutnea.   Sufre efectos adversos o una  reaccin alrgica debido a los medicamentos que toma.  SOLICITE ATENCIN MDICA DE INMEDIATO SI:  El dolor persiste o se agrava.   Tiene fiebre.   Siente el dolor slo en algunos sectores del abdomen. Si se localiza en la zona derecha, posiblemente podra tratarse de apendicitis. En un adulto, si se localiza en la regin inferior izquierda del abdomen, podra tratarse de colitis o diverticulitis.   Hay sangre en las heces (deposiciones de color rojo brillante o negro alquitranado), con o sin vmitos.   Usted presenta sangre en la orina.   Siente escalofros con o sin fiebre.   Se desmaya.  ASEGRESE QUE:   Comprende estas instrucciones.   Controlar su enfermedad.   Solicitar ayuda de inmediato si no mejora o si empeora.  Document Released: 11/18/2008 Document Revised: 07/22/2011 [facility] Patient Information 2012 [place], [place].

## 2011-11-09 ENCOUNTER — Encounter (HOSPITAL_COMMUNITY): Payer: Self-pay

## 2011-11-09 NOTE — H&P (Signed)
[name], [name]        ACCOUNT NO.:  [id number]  MEDICAL RECORD NO.:  [id number]  LOCATION:  PERIO                         FACILITY:  [facility]  PHYSICIAN:  Barbaraann Barthel, M.D. 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  CHIEF COMPLAINT:  Increasing pain in an area of her previous C-section scar.  HISTORY OF PRESENT MEDICAL ILLNESS:  The patient states that she has had 2 C-sections in the past.  The last one a year ago, and has had increasing pain in this area.  This pain is worse at times of her menstrual cycle.  I suspect an endometriosis in this area, and I have discussed this as well with the OB/GYN Service, and I will plan for exploration and excision of this endometrial implant via the outpatient department.  I discussed this in detail with the patient in Spanish and informed consent was obtained.  I discussed complications not limited to, but including bleeding, infection, and possibly further surgery might be required.  Informed consent was obtained.  PAST MEDICAL HISTORY:  Unremarkable.  Past surgery includes 2 C-sections.  MEDICATIONS:  She takes no medications on a regular basis.  She is allergic to PENICILLIN.  She is a nonsmoker, nondrinker.  PHYSICAL EXAMINATION:  GENERAL:  She is a pleasant 31 year old [place] American female. VITAL SIGNS:  She is 5 feet 4-1/2 inches, weighs 130 pounds. Temperature is 96.9, pulse is 60, respirations 12, blood pressure 84/54. HEAD:  Normocephalic. EYES:  Extraocular movements are intact.  Pupils are round and reactive to light and accommodation.  There is no conjunctival pallor or scleral injection.  The sclerae have a normal tincture. NECK:  No bruits are auscultated.  There is no adenopathy. CHEST:  Clear. HEART:  Regular rhythm without murmurs. BREASTS AND AXILLAE:   Without masses. ABDOMEN:  The patient has no visceromegaly.  She has no hernias.  She has no incisional hernia along that.  Pfannenstiel incision where her previous C-sections took place.  She does have point tenderness and a small mass effect in the left lateral portion of this scar, which is tender on palpation, which she does a Valsalva maneuver.  This does not increase in size. RECTAL:  Refused. EXTREMITIES:  Within normal limits.  REVIEW OF SYSTEMS:  OB/GYN HISTORY:  Gravida 2, para 0, cesarean 2, abortus female who has no family history of carcinoma of the breast. Her last menstrual period was November 01, 2011.  NEURO:  No history of migraines or seizures.  ENDOCRINE:  No history of diabetes or thyroid disease.  CARDIOPULMONARY:  Within normal limits.  MUSCULOSKELETAL: Within normal limits.  GI:  No history of hepatitis, constipation, diarrhea, bright red rectal bleeding, melena, inflammatory bowel disease or irritable bowel syndrome, or unexplained weight loss.  GU:  No history of dysuria, frequency, or kidney stones.  IMPRESSION:  Therefore, [name] has I think an endometrial implant by history and by clinical examination.  We will plan for an excision of this via the outpatient department.  I have explained this in detail with her in Spanish, and all questions  were answered.  I also sent her to her previous obstetrics and gynecology physician; however, she did not wish to return to him.  PLAN:  We will plan for surgery as soon as we are able to place her on the schedule.     Barbaraann Barthel, M.D.     WB/MEDQ  D:  11/09/2011  T:  11/09/2011  Job:  [id number]

## 2011-11-10 NOTE — ED Provider Notes (Signed)
History     CSN: [id number]  Arrival date & time 11/07/11  2115   First MD Initiated Contact with Patient 11/08/11 0013      Chief Complaint  Patient presents with  . Abdominal Pain    HPI: Patient is a 31 y.o. female presenting with abdominal pain. The history is provided by the patient.  Abdominal Pain The primary symptoms of the illness include abdominal pain. The primary symptoms of the illness do not include fever, nausea, vomiting, dysuria, vaginal discharge or vaginal bleeding. The current episode started more than 2 days ago. The onset of the illness was gradual. The problem has been gradually worsening.  The patient states that she believes she is currently not pregnant. The patient has not had a change in bowel habit.  HPI obtained using interpreter phones. Pt and spouse report they have been referred here by a physician at [facility] for further evaluation of her chronic pelvic pain. Pt states she has had persistent pain to her c/s surgical scar since the birth of her child a yr ago was told she had endometrial tissue in the incision  And would likely need surgery to fix the problem. The pain has been worsening over the last 2-3 months. When she was recently d/c'd from [facility] after evaluation for her (incisional pain) she was given 2 referrals, one to her original OB/GYN that did the C/S and the other was to [facility]. The pt and her spouse presented tonight thinking they had come to the surgical center. She has seen the OB/GYN (Dr Gaynell Face) but he wants a lot of money up front prior to the surgery and pt can not afford. So they believed they would be coming here to pursue other options. Pt denies any new symptoms.  Past Medical History  Diagnosis Date  . No pertinent past medical history     Past Surgical History  Procedure Date  . Cesarean section     Family History  Problem Relation Age of Onset  . Diabetes Mother   . Cancer Brother     History  Substance Use  Topics  . Smoking status: Never Smoker   . Smokeless tobacco: Not on file  . Alcohol Use: No    OB History    Grav Para Term Preterm Abortions TAB SAB Ect Mult Living   2 2 1 1  0 0 0 0 0 2      Review of Systems  Constitutional: Negative.  Negative for fever.  HENT: Negative.   Eyes: Negative.   Respiratory: Negative.   Cardiovascular: Negative.   Gastrointestinal: Positive for abdominal pain. Negative for nausea and vomiting.  Genitourinary: Negative.  Negative for dysuria, vaginal bleeding and vaginal discharge.  Musculoskeletal: Negative.   Skin: Negative.   Neurological: Negative.   Hematological: Negative.   Psychiatric/Behavioral: Negative.     Allergies  Penicillins  Home Medications   Current Outpatient Rx  Name Route Sig Dispense Refill  . ACETAMINOPHEN 325 MG PO TABS Oral Take 325 mg by mouth every 6 (six) hours as needed. For pain    . HYDROCODONE-ACETAMINOPHEN 5-325 MG PO TABS Oral Take 2 tablets by mouth every 4 (four) hours as needed. For pain    . IBUPROFEN 400 MG PO TABS Oral Take 1 tablet (400 mg total) by mouth every 6 (six) hours as needed for pain (1 by mouth 3 times a day x5 days). 15 tablet 0    BP 91/55  Pulse 68  Temp(Src)  97.7 F (36.5 C) (Oral)  Resp 17  SpO2 99%  LMP 11/01/2011  Physical Exam  Constitutional: She is oriented to person, place, and time. She appears well-developed and well-nourished.  HENT:  Head: Normocephalic and atraumatic.  Eyes: Conjunctivae are normal.  Neck: Neck supple.  Cardiovascular: Normal rate and regular rhythm.   Pulmonary/Chest: Effort normal and breath sounds normal.  Abdominal: Soft. Bowel sounds are normal.       TTP of lower midline pelvic c/s scar. TTP more pronounced over (L) side of incision. abd exam otherwise unremarkable.  Musculoskeletal: Normal range of motion.  Neurological: She is alert and oriented to person, place, and time.  Skin: Skin is warm and dry. No erythema.  Psychiatric: She  has a normal mood and affect.    ED Course  Procedures   Records reflect most recent eval at [facility] by Dr. Emelda Fear was 10/13/2011, indicates a diagnosis of "endometrioma" of c/s scar from 09/2010. I have offered. via interpreter phones to perform pelvic exam and any other indicated testing based on that exam but pt has declined. She reports some relief w/ meds for pain. States she wants to return to [facility] for further guidance regarding what to do next. Will plan for d/c home w/ short course of med for pain and encourage close f/u at [facility] for further guidance regarding available options to receive the surgery she has been told she needs. Pt agreeable w/ plan. I have also discussed pt w/ Dr Nicanor Alcon who is in agreement w/ d/c plan.     Labs Reviewed  URINALYSIS, ROUTINE W REFLEX MICROSCOPIC  POCT PREGNANCY, URINE  LAB REPORT - SCANNED  WET PREP, GENITAL   No results found.   1. Pelvic pain       MDM  HPI/PE and clinical findings/course c/w  Based on our lengthy (approx 15 min) conversation by the interpreter lines,  pt has chronic pain associated w/ her endometrioma in her c/s scar from 1 yr ago. She presented to [facility] ED thinking it was [facility] which was one of the referrals she had been given. Pt is having trouble getting anyone to address her problem because the patient can not afford to pay the "upfront"  money requested by the dr's before doing the surgery. Though she does indicate the pain has slowly become worse there have been no acute changes in the pain or new symptoms.        Roma Kayser Canary Fister, NP 11/11/11 [phone]

## 2011-11-11 ENCOUNTER — Encounter (HOSPITAL_COMMUNITY): Payer: Self-pay

## 2011-11-11 ENCOUNTER — Encounter (HOSPITAL_COMMUNITY)
Admission: RE | Admit: 2011-11-11 | Discharge: 2011-11-11 | Disposition: A | Discharge status: Home / Self Care | Payer: Self-pay | Source: Ambulatory Visit | Attending: General Surgery | Admitting: General Surgery

## 2011-11-11 LAB — HCG, SERUM, QUALITATIVE: Preg, Serum: NEGATIVE

## 2011-11-11 LAB — CBC
HCT: 37.8 % (ref 36.0–46.0)
MCHC: 34.4 g/dL (ref 30.0–36.0)
MCV: 87.9 fL (ref 78.0–100.0)
RDW: 12.1 % (ref 11.5–15.5)

## 2011-11-11 LAB — BASIC METABOLIC PANEL
Calcium: 9.6 mg/dL (ref 8.4–10.5)
Creatinine, Ser: 0.49 mg/dL — ABNORMAL LOW (ref 0.50–1.10)
GFR calc Af Amer: >90 mL/min (ref >90)

## 2011-11-11 LAB — DIFFERENTIAL
Basophils Absolute: 0 10*3/uL (ref 0.0–0.1)
Basophils Relative: 0 % (ref 0–1)
Eosinophils Relative: 2 % (ref 0–5)
Monocytes Absolute: 0.3 10*3/uL (ref 0.1–1.0)

## 2011-11-11 LAB — SURGICAL PCR SCREEN
MRSA, PCR: NEGATIVE
Staphylococcus aureus: NEGATIVE

## 2011-11-11 NOTE — ED Provider Notes (Signed)
Medical screening examination/treatment/procedure(s) were performed by non-physician practitioner and as supervising physician I was immediately available for consultation/collaboration.  Jasmine Awe, MD 11/11/11 1008

## 2011-11-11 NOTE — Patient Instructions (Addendum)
22 [name]  11/11/2011   Your procedure is scheduled on: 11/15/2011   Report to [facility] at  745  AM.  Call this number if you have problems the morning of surgery: [phone]   Remember:   Do not eat food:After Midnight.  May have clear liquids:until Midnight .  Clear liquids include soda, tea, black coffee, apple or grape juice, broth.  Take these medicines the morning of surgery with A SIP OF WATER: none   Do not wear jewelry, make-up or nail polish.  Do not wear lotions, powders, or perfumes. You may wear deodorant.  Do not shave 48 hours prior to surgery.  Do not bring valuables to the hospital.  Contacts, dentures or bridgework may not be worn into surgery.  Leave suitcase in the car. After surgery it may be brought to your room.  For patients admitted to the hospital, checkout time is 11:00 AM the day of discharge.   Patients discharged the day of surgery will not be allowed to drive home.  Name and phone number of your driver: family  Special Instructions: CHG Shower Use Special Wash: 1/2 bottle night before surgery and 1/2 bottle morning of surgery.   Please read over the following fact sheets that you were given: Pain Booklet, MRSA Information, Surgical Site Infection Prevention and Anesthesia Post-op Instructions PATIENT INSTRUCTIONS POST-ANESTHESIA  IMMEDIATELY FOLLOWING SURGERY:  Do not drive or operate machinery for the first twenty four hours after surgery.  Do not make any important decisions for twenty four hours after surgery or while taking narcotic pain medications or sedatives.  If you develop intractable nausea and vomiting or a severe headache please notify your doctor immediately.  FOLLOW-UP:  Please make an appointment with your surgeon as instructed. You do not need to follow up with anesthesia unless specifically instructed to do so.  WOUND CARE INSTRUCTIONS (if applicable):  Keep a dry clean dressing on the anesthesia/puncture wound site if there  is drainage.  Once the wound has quit draining you may leave it open to air.  Generally you should leave the bandage intact for twenty four hours unless there is drainage.  If the epidural site drains for more than 36-48 hours please call the anesthesia department.  QUESTIONS?:  Please feel free to call your physician or the hospital operator if you have any questions, and they will be happy to assist you.     [facility] Anesthesia Department [address] [place] [phone]

## 2011-11-15 ENCOUNTER — Encounter (HOSPITAL_COMMUNITY): Payer: Self-pay | Admitting: Anesthesiology

## 2011-11-15 ENCOUNTER — Encounter (HOSPITAL_COMMUNITY): Payer: Self-pay | Admitting: *Deleted

## 2011-11-15 ENCOUNTER — Ambulatory Visit (HOSPITAL_COMMUNITY): Payer: Self-pay | Admitting: Anesthesiology

## 2011-11-15 ENCOUNTER — Ambulatory Visit (HOSPITAL_COMMUNITY)
Admission: RE | Admit: 2011-11-15 | Discharge: 2011-11-15 | Disposition: A | Discharge status: Home / Self Care | Payer: Self-pay | Source: Ambulatory Visit | Attending: General Surgery | Admitting: General Surgery

## 2011-11-15 ENCOUNTER — Encounter (HOSPITAL_COMMUNITY)
Admission: RE | Disposition: A | Discharge status: Home / Self Care | Payer: Self-pay | Source: Ambulatory Visit | Attending: General Surgery

## 2011-11-15 DIAGNOSIS — L905 Scar conditions and fibrosis of skin: Secondary | ICD-10-CM | POA: Insufficient documentation

## 2011-11-15 DIAGNOSIS — N806 Endometriosis in cutaneous scar: Secondary | ICD-10-CM | POA: Insufficient documentation

## 2011-11-15 DIAGNOSIS — Z01812 Encounter for preprocedural laboratory examination: Secondary | ICD-10-CM | POA: Insufficient documentation

## 2011-11-15 DIAGNOSIS — N9489 Other specified conditions associated with female genital organs and menstrual cycle: Secondary | ICD-10-CM

## 2011-11-15 HISTORY — PX: LAPAROTOMY: SHX154

## 2011-11-15 SURGERY — LAPAROTOMY, EXPLORATORY
Anesthesia: General | Site: Abdomen | Wound class: Clean

## 2011-11-15 MED ORDER — PROPOFOL 10 MG/ML IV EMUL
INTRAVENOUS | Status: DC | PRN
  Administered 2011-11-15: 150 mg via INTRAVENOUS
  Administered 2011-11-15: 50 mg via INTRAVENOUS

## 2011-11-15 MED ORDER — LACTATED RINGERS IV SOLN
INTRAVENOUS | Status: DC
  Administered 2011-11-15: 1000 mL via INTRAVENOUS

## 2011-11-15 MED ORDER — BUPIVACAINE HCL (PF) 0.5 % IJ SOLN
INTRAMUSCULAR | Status: AC
  Filled 2011-11-15: qty 30

## 2011-11-15 MED ORDER — CIPROFLOXACIN IN D5W 400 MG/200ML IV SOLN
INTRAVENOUS | Status: AC
  Filled 2011-11-15: qty 200

## 2011-11-15 MED ORDER — MIDAZOLAM HCL 2 MG/2ML IJ SOLN
1.0000 mg | INTRAMUSCULAR | Status: DC | PRN
  Administered 2011-11-15: 2 mg via INTRAVENOUS

## 2011-11-15 MED ORDER — CIPROFLOXACIN IN D5W 400 MG/200ML IV SOLN: 400.0000 mg | INTRAVENOUS | Status: DC

## 2011-11-15 MED ORDER — ONDANSETRON HCL 4 MG/2ML IJ SOLN
INTRAMUSCULAR | Status: AC
  Filled 2011-11-15: qty 2

## 2011-11-15 MED ORDER — FENTANYL CITRATE 0.05 MG/ML IJ SOLN
INTRAMUSCULAR | Status: AC
  Filled 2011-11-15: qty 2

## 2011-11-15 MED ORDER — MIDAZOLAM HCL 2 MG/2ML IJ SOLN
INTRAMUSCULAR | Status: AC
  Filled 2011-11-15: qty 2

## 2011-11-15 MED ORDER — BUPIVACAINE HCL (PF) 0.5 % IJ SOLN
INTRAMUSCULAR | Status: DC | PRN
  Administered 2011-11-15: 10 mL

## 2011-11-15 MED ORDER — FENTANYL CITRATE 0.05 MG/ML IJ SOLN
INTRAMUSCULAR | Status: DC | PRN
  Administered 2011-11-15: 25 ug via INTRAVENOUS
  Administered 2011-11-15: 50 ug via INTRAVENOUS
  Administered 2011-11-15: 25 ug via INTRAVENOUS

## 2011-11-15 MED ORDER — LACTATED RINGERS IV SOLN
INTRAVENOUS | Status: DC | PRN
  Administered 2011-11-15: 08:00:00 via INTRAVENOUS

## 2011-11-15 MED ORDER — SODIUM CHLORIDE 0.9 % IR SOLN
Status: DC | PRN
  Administered 2011-11-15: 1000 mL

## 2011-11-15 MED ORDER — PROPOFOL 10 MG/ML IV EMUL
INTRAVENOUS | Status: AC
  Filled 2011-11-15: qty 20

## 2011-11-15 MED ORDER — LIDOCAINE HCL (PF) 1 % IJ SOLN
INTRAMUSCULAR | Status: AC
  Filled 2011-11-15: qty 5

## 2011-11-15 MED ORDER — POVIDONE-IODINE 10 % EX OINT
TOPICAL_OINTMENT | CUTANEOUS | Status: AC
  Filled 2011-11-15: qty 1

## 2011-11-15 MED ORDER — CIPROFLOXACIN IN D5W 400 MG/200ML IV SOLN
INTRAVENOUS | Status: DC | PRN
  Administered 2011-11-15: 400 mg via INTRAVENOUS

## 2011-11-15 MED ORDER — ONDANSETRON HCL 4 MG/2ML IJ SOLN
INTRAMUSCULAR | Status: DC | PRN
  Administered 2011-11-15: 4 mg via INTRAVENOUS

## 2011-11-15 MED ORDER — LIDOCAINE HCL (CARDIAC) 10 MG/ML IV SOLN
INTRAVENOUS | Status: DC | PRN
  Administered 2011-11-15: 50 mg via INTRAVENOUS

## 2011-11-15 SURGICAL SUPPLY — 69 items
APPLIER CLIP 11 MED OPEN (CLIP)
APPLIER CLIP 13 LRG OPEN (CLIP)
ATTRACTOMAT 16X20 MAGNETIC DRP (DRAPES) ×2 IMPLANT
BAG HAMPER (MISCELLANEOUS) ×2 IMPLANT
BARRIER SKIN 2 3/4 (OSTOMY) IMPLANT
CATH FOLEY 2WAY SLVR  5CC 14FR (CATHETERS) ×1
CATH FOLEY 2WAY SLVR 5CC 14FR (CATHETERS) ×1 IMPLANT
CELLS DAT CNTRL 66122 CELL SVR (MISCELLANEOUS) IMPLANT
CLAMP POUCH DRAINAGE QUIET (OSTOMY) IMPLANT
CLEANER TIP ELECTROSURG 2X2 (MISCELLANEOUS) ×2 IMPLANT
CLIP APPLIE 11 MED OPEN (CLIP) IMPLANT
CLIP APPLIE 13 LRG OPEN (CLIP) IMPLANT
CLOTH BEACON ORANGE TIMEOUT ST (SAFETY) ×2 IMPLANT
COVER MAYO STAND XLG (DRAPE) IMPLANT
COVER SURGICAL LIGHT HANDLE (MISCELLANEOUS) ×4 IMPLANT
DRAPE PROXIMA HALF (DRAPES) ×2 IMPLANT
DRAPE WARM FLUID 44X44 (DRAPE) IMPLANT
ELECT REM PT RETURN 9FT ADLT (ELECTROSURGICAL) ×2
ELECTRODE REM PT RTRN 9FT ADLT (ELECTROSURGICAL) ×1 IMPLANT
GLOVE SKINSENSE NS SZ7.0 (GLOVE) ×2
GLOVE SKINSENSE STRL SZ7.0 (GLOVE) ×2 IMPLANT
GOWN STRL REIN XL XLG (GOWN DISPOSABLE) ×8 IMPLANT
INST SET MAJOR GENERAL (KITS) ×2 IMPLANT
KIT ROOM TURNOVER APOR (KITS) ×2 IMPLANT
LIGASURE IMPACT 36 18CM CVD LR (INSTRUMENTS) IMPLANT
MANIFOLD NEPTUNE II (INSTRUMENTS) ×2 IMPLANT
NS IRRIG 1000ML POUR BTL (IV SOLUTION) ×2 IMPLANT
PACK ABDOMINAL MAJOR (CUSTOM PROCEDURE TRAY) ×2 IMPLANT
PAD ARMBOARD 7.5X6 YLW CONV (MISCELLANEOUS) ×2 IMPLANT
POUCH OSTOMY 2 3/4  H 3804 (WOUND CARE)
POUCH OSTOMY 2 PC DRNBL 2.75 (WOUND CARE) IMPLANT
RELOAD LINEAR CUT PROX 55 BLUE (ENDOMECHANICALS) IMPLANT
RELOAD PROXIMATE 75MM BLUE (ENDOMECHANICALS) IMPLANT
RETAINER VISCERA MED (MISCELLANEOUS) IMPLANT
RETRACTOR WND ALEXIS 25 LRG (MISCELLANEOUS) IMPLANT
RETRACTOR WOUND ALXS 34CM XLRG (MISCELLANEOUS) IMPLANT
RTRCTR WOUND ALEXIS 18CM MED (MISCELLANEOUS)
RTRCTR WOUND ALEXIS 25CM LRG (MISCELLANEOUS)
RTRCTR WOUND ALEXIS 34CM XLRG (MISCELLANEOUS)
SET BASIN LINEN APH (SET/KITS/TRAYS/PACK) ×2 IMPLANT
SOL PREP PROV IODINE SCRUB 4OZ (MISCELLANEOUS) ×2 IMPLANT
SPONGE GAUZE 4X4 12PLY (GAUZE/BANDAGES/DRESSINGS) ×2 IMPLANT
SPONGE INTESTINAL PEANUT (DISPOSABLE) ×2 IMPLANT
SPONGE LAP 18X18 X RAY DECT (DISPOSABLE) IMPLANT
STAPLER AUT SUT LDS 15W (STAPLE) IMPLANT
STAPLER GUN LINEAR PROX 60 (STAPLE) IMPLANT
STAPLER PROXIMATE 55 BLUE (STAPLE) IMPLANT
STAPLER PROXIMATE 75MM BLUE (STAPLE) IMPLANT
STAPLER VISISTAT 35W (STAPLE) ×2 IMPLANT
SUCTION POOLE TIP (SUCTIONS) ×2 IMPLANT
SUT CHROMIC 4 0 PS 2 18 (SUTURE) IMPLANT
SUT NOVA NAB GS-26 0 60 (SUTURE) ×4 IMPLANT
SUT PDS AB 3-0 SH 27 (SUTURE) ×2 IMPLANT
SUT PROLENE 0 CT 1 CR/8 (SUTURE) ×4 IMPLANT
SUT PROLENE 1 CT 1 30 (SUTURE) ×4 IMPLANT
SUT SILK 2 0 (SUTURE) ×1
SUT SILK 2-0 18XBRD TIE 12 (SUTURE) ×1 IMPLANT
SUT SILK 3 0 SH CR/8 (SUTURE) ×4 IMPLANT
SUT VIC AB 0 CT1 27 (SUTURE)
SUT VIC AB 0 CT1 27XBRD ANTBC (SUTURE) IMPLANT
SUT VIC AB 0 CT1 27XCR 8 STRN (SUTURE) IMPLANT
SUT VIC AB 3-0 SH 27 (SUTURE) ×1
SUT VIC AB 3-0 SH 27X BRD (SUTURE) ×1 IMPLANT
SUT VICRYL AB 2 0 TIES (SUTURE) ×2 IMPLANT
SUT VICRYL AB 3 0 TIES (SUTURE) IMPLANT
SYR BULB IRRIGATION 50ML (SYRINGE) ×2 IMPLANT
TAPE CLOTH SURG 4X10 WHT LF (GAUZE/BANDAGES/DRESSINGS) ×2 IMPLANT
TRAY FOLEY CATH 14FR (SET/KITS/TRAYS/PACK) ×2 IMPLANT
WATER STERILE IRR 1000ML POUR (IV SOLUTION) ×4 IMPLANT

## 2011-11-15 NOTE — Progress Notes (Signed)
31 yr old mex female with endometrial implant from previous C-sections (x2).  Have discussed[pre op with Ob Gyne service.  Will explore C-section wound for removal of ectopic endometrial tissue.  Surgery discussed in detail in Spanish and all questions answered. Labs reviewed.  No change clinically from time of H&P.  H&P dict. # [id number].  Filed Vitals:   11/15/11 0804  BP: 109/59  Pulse:   Temp:   Resp:   pulse 81/min, BP 109/59; O2 sat 100%.

## 2011-11-15 NOTE — Preoperative (Signed)
Beta Blockers   Reason not to administer Beta Blockers:Not Applicable 

## 2011-11-15 NOTE — Progress Notes (Signed)
Post Op Check:  A little drowsy but doing well.  Dressings dry and in tact.  Min. Discomfort.  Pt has pain meds at home will not order new prescription.  She was taking tylenol at home I doubt that there is an allergy here despite nursing documentation.  Filed Vitals:   11/15/11 1115  BP: 117/59  Pulse: 89  Temp: 98.1 F (36.7 C)  Resp: 16    Doing well; will follow up in AM.  Instructions given in Spanish.

## 2011-11-15 NOTE — Brief Op Note (Signed)
11/15/2011  11:18 AM  PATIENT:  [name]  31 y.o. female  PRE-OPERATIVE DIAGNOSIS:  abd. pain, mass  POST-OPERATIVE DIAGNOSIS:  abd. pain, mass  PROCEDURE:  Procedure(s) (LRB): EXPLORATORY LAPAROTOMY (N/A)  SURGEON:  Surgeon(s) and Role:    * Marlane Hatcher, MD - Primary  PHYSICIAN ASSISTANT:   ASSISTANTS: none   ANESTHESIA:   general  EBL:  Total I/O In: 600 [I.V.:600] Out: 100 [Urine:100]  BLOOD ADMINISTERED:none  DRAINS: none   LOCAL MEDICATIONS USED:  MARCAINE     SPECIMEN:  Source of Specimen:  scar tissue (endometrial implant) from c-section  DISPOSITION OF SPECIMEN:  PATHOLOGY  COUNTS:  YES  TOURNIQUET:  * No tourniquets in log *  DICTATION: .Other Dictation: Dictation Number OR dict. # [id number]  PLAN OF CARE: Discharge to home after PACU  PATIENT DISPOSITION:  PACU - hemodynamically stable.   Delay start of Pharmacological VTE agent (>24hrs) due to surgical blood loss or risk of bleeding: not applicable

## 2011-11-15 NOTE — Transfer of Care (Signed)
Immediate Anesthesia Transfer of Care Note  Patient: [name]  Procedure(s) Performed: Procedure(s) (LRB): EXPLORATORY LAPAROTOMY (N/A)  Patient Location: PACU  Anesthesia Type: General  Level of Consciousness: awake, alert , oriented and patient cooperative  Airway & Oxygen Therapy: Patient Spontanous Breathing and Patient connected to face mask oxygen  Post-op Assessment: Report given to PACU RN and Post -op Vital signs reviewed and stable  Post vital signs: Reviewed and stable  Complications: No apparent anesthesia complications

## 2011-11-15 NOTE — Anesthesia Preprocedure Evaluation (Signed)
Anesthesia Evaluation  Patient identified by MRN, date of birth, ID band Patient awake    Reviewed: Allergy & Precautions, H&P , NPO status , Patient's Chart, lab work & pertinent test results  History of Anesthesia Complications Negative for: history of anesthetic complications  Airway Mallampati: II  Neck ROM: Full    Dental  (+) Teeth Intact   Pulmonary neg pulmonary ROS,  breath sounds clear to auscultation        Cardiovascular negative cardio ROS  Rhythm:Regular     Neuro/Psych    GI/Hepatic   Endo/Other    Renal/GU      Musculoskeletal   Abdominal   Peds  Hematology   Anesthesia Other Findings   Reproductive/Obstetrics                           Anesthesia Physical Anesthesia Plan  ASA: I  Anesthesia Plan: General   Post-op Pain Management:    Induction: Intravenous  Airway Management Planned: LMA  Additional Equipment:   Intra-op Plan:   Post-operative Plan: Extubation in OR  Informed Consent: I have reviewed the patients History and Physical, chart, labs and discussed the procedure including the risks, benefits and alternatives for the proposed anesthesia with the patient or authorized representative who has indicated his/her understanding and acceptance.     Plan Discussed with:   Anesthesia Plan Comments:         Anesthesia Quick Evaluation  

## 2011-11-15 NOTE — Anesthesia Postprocedure Evaluation (Signed)
  Anesthesia Post-op Note  Patient: [name]  Procedure(s) Performed: Procedure(s) (LRB): EXPLORATORY LAPAROTOMY (N/A)  Patient Location: PACU  Anesthesia Type: General  Level of Consciousness: awake, alert , oriented and patient cooperative  Airway and Oxygen Therapy: Patient Spontanous Breathing and Patient connected to face mask oxygen  Post-op Pain: none  Post-op Assessment: Post-op Vital signs reviewed, Patient's Cardiovascular Status Stable, Respiratory Function Stable, Patent Airway, No signs of Nausea or vomiting and Pain level controlled  Post-op Vital Signs: Reviewed and stable  Complications: No apparent anesthesia complications

## 2011-11-15 NOTE — Op Note (Signed)
NAMEAHMANI, DAOUD        ACCOUNT NO.:  [id number]  MEDICAL RECORD NO.:  [id number]  LOCATION:  [facility]                          FACILITY:  [facility]  PHYSICIAN:  Barbaraann Barthel, M.D. DATE OF BIRTH:  03-09-1981  DATE OF PROCEDURE:  11/15/2011 DATE OF DISCHARGE:  11/15/2011                              OPERATIVE REPORT   NOTE:  This is a 31 year old [place] female who presented with a painful cesarean scar, this had an area of pin point tenderness with thickening that was worse during her menstrual period.  We had sent her back to her gynecologist, however, he did not see her and she returned to me and we planned and I had an oral consultation with an OB/GYN here, I suspected that this was an endometrial implant and we planned for surgery via the Outpatient Department.  We discussed complications not limited to, but including bleeding, infection, the possibility of further surgery might be required.  Informed consent was obtained.  GROSS OPERATIVE FINDINGS:  The patient had a thickened area of scar tissue that was fairly circumscribed, but with a lot of fibrosis and thickening around that area.  This was removed off of the fascia and sent to Pathology as a specimen.  There were no other abnormalities encountered.  TECHNIQUE:  The patient was placed in supine position and after the adequate administration of LMA anesthesia, she was prepped with Betadine solution and draped in usual manner.  The patient had had a previous Pfannenstiel incision and I had previously marked with a sterile marking pen, the area of maximum tenderness and the area which coincided with the area of a palpable mass in the left lateral portion of her scar.  I opened this area and removed the thickened area right down to the fascia and removed this and sent it as a specimen.  We checked for hemostasis, this was controlled easily with a cautery device.  I used 0.5% Sensorcaine to anesthetize the fascia and  the skin for postoperative discomfort, I used approximately 10 mL.  The wound was then irrigated and the subcutaneous tissue was closed with 3-0 Polysorb and the skin was approximated with stapling device.  Prior to closure, all sponge, needle, and instrument counts were found to be correct.  The patient received approximately 600 mL of crystalloids intraoperatively.  No drains were placed.  There were no complications.  Wound classification was clean.  There were no problems.     Barbaraann Barthel, M.D.     WB/MEDQ  D:  11/15/2011  T:  11/15/2011  Job:  [id number]  cc:   Dr. Emelda Fear

## 2011-11-15 NOTE — Anesthesia Procedure Notes (Signed)
Procedure Name: LMA Insertion Date/Time: 11/15/2011 10:27 AM Performed by: Carolyne Littles, Brittiany Wiehe L Pre-anesthesia Checklist: Patient identified, Emergency Drugs available, Timeout performed, Suction available and Patient being monitored Patient Re-evaluated:Patient Re-evaluated prior to inductionOxygen Delivery Method: Circle system utilized Preoxygenation: Pre-oxygenation with 100% oxygen Intubation Type: IV induction Ventilation: Mask ventilation without difficulty LMA: LMA inserted LMA Size: 3.0 Number of attempts: 1 Placement Confirmation: positive ETCO2 and breath sounds checked- equal and bilateral Tube secured with: Tape Dental Injury: Teeth and Oropharynx as per pre-operative assessment

## 2011-11-18 ENCOUNTER — Encounter (HOSPITAL_COMMUNITY): Payer: Self-pay | Admitting: General Surgery

## 2012-12-25 ENCOUNTER — Emergency Department (HOSPITAL_COMMUNITY): Payer: No Typology Code available for payment source

## 2012-12-25 ENCOUNTER — Encounter (HOSPITAL_COMMUNITY): Payer: Self-pay

## 2012-12-25 ENCOUNTER — Emergency Department (HOSPITAL_COMMUNITY)
Admission: EM | Admit: 2012-12-25 | Discharge: 2012-12-26 | Disposition: A | Discharge status: Home / Self Care | Payer: No Typology Code available for payment source | Attending: Emergency Medicine | Admitting: Emergency Medicine

## 2012-12-25 DIAGNOSIS — S4991XA Unspecified injury of right shoulder and upper arm, initial encounter: Secondary | ICD-10-CM

## 2012-12-25 DIAGNOSIS — Y9389 Activity, other specified: Secondary | ICD-10-CM | POA: Insufficient documentation

## 2012-12-25 DIAGNOSIS — S4980XA Other specified injuries of shoulder and upper arm, unspecified arm, initial encounter: Secondary | ICD-10-CM | POA: Insufficient documentation

## 2012-12-25 DIAGNOSIS — Z3202 Encounter for pregnancy test, result negative: Secondary | ICD-10-CM | POA: Insufficient documentation

## 2012-12-25 DIAGNOSIS — Y92410 Unspecified street and highway as the place of occurrence of the external cause: Secondary | ICD-10-CM | POA: Insufficient documentation

## 2012-12-25 DIAGNOSIS — Z880 Allergy status to penicillin: Secondary | ICD-10-CM | POA: Insufficient documentation

## 2012-12-25 DIAGNOSIS — S46909A Unspecified injury of unspecified muscle, fascia and tendon at shoulder and upper arm level, unspecified arm, initial encounter: Secondary | ICD-10-CM | POA: Insufficient documentation

## 2012-12-25 DIAGNOSIS — IMO0002 Reserved for concepts with insufficient information to code with codable children: Secondary | ICD-10-CM | POA: Insufficient documentation
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
## 2012-12-26 LAB — PREGNANCY, URINE: Preg Test, Ur: NEGATIVE

## 2012-12-26 MED ORDER — HYDROCODONE-ACETAMINOPHEN 5-325 MG PO TABS: 1.0000 | ORAL_TABLET | Freq: Once | ORAL | Status: DC

## 2012-12-26 MED ORDER — HYDROCODONE-ACETAMINOPHEN 5-325 MG PO TABS: 1.0000 | ORAL_TABLET | Freq: Four times a day (QID) | ORAL | Status: DC | PRN

## 2012-12-26 MED ORDER — OXYCODONE HCL 5 MG PO TABA: 1.0000 | ORAL_TABLET | Freq: Four times a day (QID) | ORAL | Status: DC | PRN

## 2012-12-26 MED ORDER — OXYCODONE HCL 5 MG PO TABS
5.0000 mg | ORAL_TABLET | Freq: Once | ORAL | Status: AC
  Administered 2012-12-26: 5 mg via ORAL
  Filled 2012-12-26: qty 1

## 2013-01-22 ENCOUNTER — Other Ambulatory Visit (HOSPITAL_COMMUNITY): Payer: Self-pay | Admitting: General Surgery

## 2013-01-22 DIAGNOSIS — R109 Unspecified abdominal pain: Secondary | ICD-10-CM

## 2013-01-25 ENCOUNTER — Ambulatory Visit (HOSPITAL_COMMUNITY)
Admission: RE | Admit: 2013-01-25 | Discharge: 2013-01-25 | Disposition: A | Discharge status: Home / Self Care | Payer: Self-pay | Source: Ambulatory Visit | Attending: General Surgery | Admitting: General Surgery

## 2013-01-25 DIAGNOSIS — R109 Unspecified abdominal pain: Secondary | ICD-10-CM

## 2013-01-29 ENCOUNTER — Ambulatory Visit (HOSPITAL_COMMUNITY)
Admission: RE | Admit: 2013-01-29 | Discharge: 2013-01-29 | Disposition: A | Discharge status: Home / Self Care | Payer: Self-pay | Source: Ambulatory Visit | Attending: General Surgery | Admitting: General Surgery

## 2013-01-29 DIAGNOSIS — R935 Abnormal findings on diagnostic imaging of other abdominal regions, including retroperitoneum: Secondary | ICD-10-CM | POA: Insufficient documentation

## 2013-01-29 DIAGNOSIS — R109 Unspecified abdominal pain: Secondary | ICD-10-CM | POA: Insufficient documentation

## 2013-01-29 DIAGNOSIS — K429 Umbilical hernia without obstruction or gangrene: Secondary | ICD-10-CM | POA: Insufficient documentation

## 2013-01-29 MED ORDER — IOHEXOL 300 MG/ML  SOLN
100.0000 mL | Freq: Once | INTRAMUSCULAR | Status: AC | PRN
  Administered 2013-01-29: 100 mL via INTRAVENOUS

## 2013-04-06 ENCOUNTER — Encounter (HOSPITAL_COMMUNITY): Payer: Self-pay

## 2013-04-06 ENCOUNTER — Encounter (HOSPITAL_COMMUNITY): Payer: Self-pay | Admitting: Pharmacy Technician

## 2013-04-06 ENCOUNTER — Encounter (HOSPITAL_COMMUNITY)
Admission: RE | Admit: 2013-04-06 | Discharge: 2013-04-06 | Disposition: A | Discharge status: Home / Self Care | Payer: Self-pay | Source: Ambulatory Visit | Attending: General Surgery | Admitting: General Surgery

## 2013-04-06 DIAGNOSIS — Z01812 Encounter for preprocedural laboratory examination: Secondary | ICD-10-CM | POA: Insufficient documentation

## 2013-04-06 HISTORY — DX: Other specified postprocedural states: R11.2

## 2013-04-06 HISTORY — DX: Nausea with vomiting, unspecified: Z98.890

## 2013-04-06 LAB — CBC WITH DIFFERENTIAL/PLATELET
Basophils Relative: 0 % (ref 0–1)
HCT: 37.7 % (ref 36.0–46.0)
Hemoglobin: 13.4 g/dL (ref 12.0–15.0)
MCHC: 35.5 g/dL (ref 30.0–36.0)
MCV: 87.5 fL (ref 78.0–100.0)
Monocytes Absolute: 0.3 10*3/uL (ref 0.1–1.0)
Monocytes Relative: 5 % (ref 3–12)
Neutro Abs: 3 10*3/uL (ref 1.7–7.7)

## 2013-04-06 LAB — BASIC METABOLIC PANEL
BUN: 12 mg/dL (ref 6–23)
CO2: 27 mEq/L (ref 19–32)
Chloride: 102 mEq/L (ref 96–112)
Creatinine, Ser: 0.51 mg/dL (ref 0.50–1.10)

## 2013-04-06 NOTE — Patient Instructions (Addendum)
[name]  04/06/2013   Your procedure is scheduled on:  Monday,August 25  Report to [facility] at 0800 AM.  Call this number if you have problems the morning of surgery: [phone]   Remember:   Do not eat food or drink liquids after midnight.   Take these medicines the morning of surgery with A SIP OF WATER: Allegra,   Do not wear jewelry, make-up or nail polish.  Do not wear lotions, powders, or perfumes. You may wear deodorant.  Do not shave 48 hours prior to surgery. Men may shave face and neck.  Do not bring valuables to the hospital.  [facility] is not responsible                   for any belongings or valuables.  Contacts, dentures or bridgework may not be worn into surgery.  Leave suitcase in the car. After surgery it may be brought to your room.  For patients admitted to the hospital, checkout time is 11:00 AM the day of  discharge.   Patients discharged the day of surgery will not be allowed to drive  home.  Name and phone number of your driver: Husband  Special Instructions: Shower using CHG 2 nights before surgery and the night before surgery.  If you shower the day of surgery use CHG.  Use special wash - you have one bottle of CHG for all showers.  You should use approximately 1/3 of the bottle for each shower.   Please read over the following fact sheets that you were given: Pain Booklet, Coughing and Deep Breathing, MRSA Information, Surgical Site Infection Prevention and Anesthesia Post-op Instructions   PATIENT INSTRUCTIONS POST-ANESTHESIA  IMMEDIATELY FOLLOWING SURGERY:  Do not drive or operate machinery for the first twenty four hours after surgery.  Do not make any important decisions for twenty four hours after surgery or while taking narcotic pain medications or sedatives.  If you develop intractable nausea and vomiting or a severe headache please notify your doctor immediately.  FOLLOW-UP:  Please make an appointment with your surgeon as instructed.  You do not need to follow up with anesthesia unless specifically instructed to do so.  WOUND CARE INSTRUCTIONS (if applicable):  Keep a dry clean dressing on the anesthesia/puncture wound site if there is drainage.  Once the wound has quit draining you may leave it open to air.  Generally you should leave the bandage intact for twenty four hours unless there is drainage.  If the epidural site drains for more than 36-48 hours please call the anesthesia department.  QUESTIONS?:  Please feel free to call your physician or the hospital operator if you have any questions, and they will be happy to assist you.

## 2013-04-06 NOTE — Consult Note (Signed)
**Note De-Identified via [name]** [name], [name]        ACCOUNT NO.:  [id number]  MEDICAL RECORD NO.:  [id number]  LOCATION:  [facility]                          FACILITY:  [facility]  PHYSICIAN:  Barbaraann Barthel, M.D. DATE OF BIRTH:  17-Jan-1981  DATE OF CONSULTATION: DATE OF DISCHARGE:                                CONSULTATION   DIAGNOSIS:  Recurrent endometrioma.  HISTORY OF PRESENT ILLNESS:  This is a 32 year old Hispanic female, who had 2 C-sections, and had a complication of an endometrioma, which are removed above her cesarean scar on the left lateral portion of her Pfannenstiel incision in 2013.  She now returns with the same symptomatology of pain during her menses, and we followed this for while and she is currently menstruating during this examination and the pain is exquisite in different area above the Pfannenstiel incision, which I marked.  We will plan for an excision of this recurrent endometrioma.  PAST MEDICAL HISTORY:  Noncontributory.  PAST SURGICAL HISTORY:  The patient's surgeries have included 2 C- sections, and excision of an endometrioma in 2013.  ALLERGIES:  She is allergic to penicillin.  She takes no medications on a regular basis.  She is a nondrinker and nonsmoker.  PHYSICAL EXAMINATION:  VITAL SIGNS:  She is 5 feet 4.5 inches tall and weighs 128 pounds.  Temperature is 98.0, pulse is 72 and regular, respirations 12, blood pressure 90/60. HEENT:  Head is normocephalic.  Eyes, extraocular movements are intact. Pupils are equal, round, and reactive to light and accommodation.  There is no conjunctival pallor or scleral injection.  The sclera is a normal tincture.  Her mouth and oral mucosa are moist. NECK:  No bruits are appreciated.  No adenopathy.  No thyromegaly. CHEST:  Clear both anterior and posterior auscultation. HEART:  Regular rhythm. BREASTS:  Breasts and axilla without masses. ABDOMEN:  Soft.  The patient has an area just in the midline above the C- section scar,  which is exquisitely tender and sometimes the right side of the incision as well is tender as the pain tends to radiate that way a little bit all near the area of the C-section scar.  The area on the left side above the scar were I operated in 2013 is completely without any symptoms. RECTAL:  Deferred. PELVIC:  Obviously, the patient is having her menses now. EXTREMITIES:  Within normal limits.  REVIEW OF SYSTEMS:  NEURO:  Within normal limits.  No neurological lateralizing signs and no migraines, no seizures. ENDOCRINE:  No history of diabetes, thyroid disease, or adrenal problems.  CARDIOPULMONARY SYSTEM:  Within normal limits. MUSCULOSKELETAL SYSTEM:  Within normal limits.  OB/GYN:  She is a gravida 2, para 0, cesarean 2, abortus 0 female who is currently having her menses.  No family history of carcinoma of the breast. GI SYSTEM:  No history of hepatitis, constipation, diarrhea, bright red rectal bleeding, melena, inflammatory bowel disease, or irritable bowel syndrome.  No history of unexplained weight loss.  GU SYSTEM:  No history of frequency and dysuria or kidney stones.  REVIEW OF HISTORY AND PHYSICAL:  Therefore, [name] is a 32 year old Hispanic female with recurrent endometrioma.  We will plan to take care of this via the outpatient department.  We discussed complications not limited to, but including bleeding, infection, and possibly recurrence.  Informed consent was obtained.     Barbaraann Barthel, M.D.     WB/MEDQ  D:  04/06/2013  T:  04/06/2013  Job:  [id number]

## 2013-04-09 ENCOUNTER — Encounter (HOSPITAL_COMMUNITY): Payer: Self-pay | Admitting: *Deleted

## 2013-04-09 ENCOUNTER — Encounter (HOSPITAL_COMMUNITY): Payer: Self-pay | Admitting: Anesthesiology

## 2013-04-09 ENCOUNTER — Ambulatory Visit (HOSPITAL_COMMUNITY): Payer: Self-pay | Admitting: Anesthesiology

## 2013-04-09 ENCOUNTER — Ambulatory Visit (HOSPITAL_COMMUNITY)
Admission: RE | Admit: 2013-04-09 | Discharge: 2013-04-09 | Disposition: A | Discharge status: Home / Self Care | Payer: MEDICAID | Source: Ambulatory Visit | Attending: General Surgery | Admitting: General Surgery

## 2013-04-09 ENCOUNTER — Encounter (HOSPITAL_COMMUNITY)
Admission: RE | Disposition: A | Discharge status: Home / Self Care | Payer: Self-pay | Source: Ambulatory Visit | Attending: General Surgery

## 2013-04-09 DIAGNOSIS — N806 Endometriosis in cutaneous scar: Secondary | ICD-10-CM | POA: Insufficient documentation

## 2013-04-09 HISTORY — PX: MASS EXCISION: SHX2000

## 2013-04-09 SURGERY — EXCISION MASS
Anesthesia: General | Site: Abdomen | Wound class: Clean

## 2013-04-09 MED ORDER — LIDOCAINE HCL (PF) 1 % IJ SOLN
INTRAMUSCULAR | Status: AC
  Filled 2013-04-09: qty 5

## 2013-04-09 MED ORDER — CIPROFLOXACIN IN D5W 400 MG/200ML IV SOLN
INTRAVENOUS | Status: DC | PRN
  Administered 2013-04-09: 400 mg via INTRAVENOUS

## 2013-04-09 MED ORDER — FENTANYL CITRATE 0.05 MG/ML IJ SOLN
INTRAMUSCULAR | Status: AC
  Filled 2013-04-09: qty 5

## 2013-04-09 MED ORDER — BACITRACIN-NEOMYCIN-POLYMYXIN 400-5-5000 EX OINT
TOPICAL_OINTMENT | CUTANEOUS | Status: AC
  Filled 2013-04-09: qty 2

## 2013-04-09 MED ORDER — FENTANYL CITRATE 0.05 MG/ML IJ SOLN
INTRAMUSCULAR | Status: DC | PRN
  Administered 2013-04-09 (×2): 50 ug via INTRAVENOUS

## 2013-04-09 MED ORDER — PROPOFOL 10 MG/ML IV EMUL
INTRAVENOUS | Status: AC
  Filled 2013-04-09: qty 20

## 2013-04-09 MED ORDER — PROMETHAZINE HCL 25 MG RE SUPP: 25.0000 mg | Freq: Four times a day (QID) | RECTAL | Status: DC | PRN

## 2013-04-09 MED ORDER — SCOPOLAMINE 1 MG/3DAYS TD PT72
MEDICATED_PATCH | TRANSDERMAL | Status: AC
  Filled 2013-04-09: qty 1

## 2013-04-09 MED ORDER — PROPOFOL 10 MG/ML IV BOLUS
INTRAVENOUS | Status: DC | PRN
  Administered 2013-04-09: 130 mg via INTRAVENOUS

## 2013-04-09 MED ORDER — ONDANSETRON HCL 4 MG/2ML IJ SOLN
4.0000 mg | Freq: Once | INTRAMUSCULAR | Status: AC
  Administered 2013-04-09: 4 mg via INTRAVENOUS

## 2013-04-09 MED ORDER — ONDANSETRON HCL 4 MG/2ML IJ SOLN
INTRAMUSCULAR | Status: AC
  Filled 2013-04-09: qty 2

## 2013-04-09 MED ORDER — DEXAMETHASONE SODIUM PHOSPHATE 4 MG/ML IJ SOLN
4.0000 mg | Freq: Once | INTRAMUSCULAR | Status: AC
  Administered 2013-04-09: 4 mg via INTRAVENOUS

## 2013-04-09 MED ORDER — BUPIVACAINE HCL (PF) 0.5 % IJ SOLN
INTRAMUSCULAR | Status: AC
  Filled 2013-04-09: qty 30

## 2013-04-09 MED ORDER — FENTANYL CITRATE 0.05 MG/ML IJ SOLN
INTRAMUSCULAR | Status: AC
  Filled 2013-04-09: qty 2

## 2013-04-09 MED ORDER — MIDAZOLAM HCL 2 MG/2ML IJ SOLN
INTRAMUSCULAR | Status: AC
  Filled 2013-04-09: qty 2

## 2013-04-09 MED ORDER — DEXTROSE 5 % IV SOLN
INTRAVENOUS | Status: DC | PRN
  Administered 2013-04-09: 10:00:00 via INTRAVENOUS

## 2013-04-09 MED ORDER — TRAMADOL HCL 50 MG PO TABS: 50.0000 mg | ORAL_TABLET | Freq: Four times a day (QID) | ORAL | Status: DC | PRN

## 2013-04-09 MED ORDER — CIPROFLOXACIN IN D5W 400 MG/200ML IV SOLN: 400.0000 mg | Freq: Once | INTRAVENOUS | Status: DC

## 2013-04-09 MED ORDER — STERILE WATER FOR IRRIGATION IR SOLN
Status: DC | PRN
  Administered 2013-04-09: 2000 mL

## 2013-04-09 MED ORDER — ROCURONIUM BROMIDE 50 MG/5ML IV SOLN
INTRAVENOUS | Status: AC
  Filled 2013-04-09: qty 1

## 2013-04-09 MED ORDER — MIDAZOLAM HCL 5 MG/5ML IJ SOLN
INTRAMUSCULAR | Status: DC | PRN
  Administered 2013-04-09: 2 mg via INTRAVENOUS

## 2013-04-09 MED ORDER — SODIUM CHLORIDE 0.9 % IJ SOLN
INTRAMUSCULAR | Status: AC
  Filled 2013-04-09: qty 10

## 2013-04-09 MED ORDER — MIDAZOLAM HCL 2 MG/2ML IJ SOLN
1.0000 mg | INTRAMUSCULAR | Status: DC | PRN
  Administered 2013-04-09: 2 mg via INTRAVENOUS

## 2013-04-09 MED ORDER — ONDANSETRON HCL 4 MG/2ML IJ SOLN
4.0000 mg | Freq: Once | INTRAMUSCULAR | Status: AC | PRN
  Administered 2013-04-09: 4 mg via INTRAVENOUS

## 2013-04-09 MED ORDER — FENTANYL CITRATE 0.05 MG/ML IJ SOLN
25.0000 ug | INTRAMUSCULAR | Status: DC | PRN
  Administered 2013-04-09 (×2): 50 ug via INTRAVENOUS

## 2013-04-09 MED ORDER — LIDOCAINE HCL 1 % IJ SOLN
INTRAMUSCULAR | Status: DC | PRN
  Administered 2013-04-09: 40 mg via INTRADERMAL

## 2013-04-09 MED ORDER — SCOPOLAMINE 1 MG/3DAYS TD PT72
1.0000 | MEDICATED_PATCH | Freq: Once | TRANSDERMAL | Status: DC
  Administered 2013-04-09: 1.5 mg via TRANSDERMAL

## 2013-04-09 MED ORDER — SODIUM CHLORIDE 0.9 % IR SOLN
Status: DC | PRN
  Administered 2013-04-09: 1000 mL

## 2013-04-09 MED ORDER — PROMETHAZINE HCL 25 MG/ML IJ SOLN
6.2500 mg | Freq: Once | INTRAMUSCULAR | Status: AC
  Administered 2013-04-09: 6.25 mg via INTRAVENOUS

## 2013-04-09 MED ORDER — LACTATED RINGERS IV SOLN
INTRAVENOUS | Status: DC
  Administered 2013-04-09: 09:00:00 via INTRAVENOUS

## 2013-04-09 MED ORDER — CIPROFLOXACIN IN D5W 200 MG/100ML IV SOLN
INTRAVENOUS | Status: AC
  Filled 2013-04-09: qty 200

## 2013-04-09 MED ORDER — PROMETHAZINE HCL 25 MG/ML IJ SOLN
INTRAMUSCULAR | Status: AC
  Filled 2013-04-09: qty 1

## 2013-04-09 MED ORDER — BUPIVACAINE HCL 0.5 % IJ SOLN
INTRAMUSCULAR | Status: DC | PRN
  Administered 2013-04-09: 17 mL

## 2013-04-09 MED ORDER — DEXAMETHASONE SODIUM PHOSPHATE 4 MG/ML IJ SOLN
INTRAMUSCULAR | Status: AC
  Filled 2013-04-09: qty 1

## 2013-04-09 MED ORDER — FENTANYL CITRATE 0.05 MG/ML IJ SOLN
25.0000 ug | INTRAMUSCULAR | Status: AC
  Administered 2013-04-09 (×2): 25 ug via INTRAVENOUS

## 2013-04-09 SURGICAL SUPPLY — 38 items
BAG HAMPER (MISCELLANEOUS) ×2 IMPLANT
BLADE KNIFE PERSONA 15 (BLADE) ×2 IMPLANT
CLOTH BEACON ORANGE TIMEOUT ST (SAFETY) ×2 IMPLANT
COVER LIGHT HANDLE STERIS (MISCELLANEOUS) ×4 IMPLANT
DECANTER SPIKE VIAL GLASS SM (MISCELLANEOUS) ×2 IMPLANT
ELECT NEEDLE TIP 2.8 STRL (NEEDLE) ×2 IMPLANT
ELECT REM PT RETURN 9FT ADLT (ELECTROSURGICAL) ×2
ELECTRODE REM PT RTRN 9FT ADLT (ELECTROSURGICAL) ×1 IMPLANT
FORMALIN 10 PREFIL 120ML (MISCELLANEOUS) ×2 IMPLANT
GLOVE BIOGEL PI IND STRL 7.0 (GLOVE) ×2 IMPLANT
GLOVE BIOGEL PI IND STRL 7.5 (GLOVE) ×1 IMPLANT
GLOVE BIOGEL PI INDICATOR 7.0 (GLOVE) ×2
GLOVE BIOGEL PI INDICATOR 7.5 (GLOVE) ×1
GLOVE SKINSENSE NS SZ7.0 (GLOVE) ×1
GLOVE SKINSENSE STRL SZ7.0 (GLOVE) ×1 IMPLANT
GLOVE SS BIOGEL STRL SZ 6.5 (GLOVE) ×2 IMPLANT
GLOVE SUPERSENSE BIOGEL SZ 6.5 (GLOVE) ×2
GOWN STRL REIN XL XLG (GOWN DISPOSABLE) ×6 IMPLANT
KIT ROOM TURNOVER APOR (KITS) ×2 IMPLANT
MANIFOLD NEPTUNE II (INSTRUMENTS) ×2 IMPLANT
NEEDLE HYPO 25X1 1.5 SAFETY (NEEDLE) ×2 IMPLANT
NS IRRIG 1000ML POUR BTL (IV SOLUTION) ×2 IMPLANT
PACK MINOR (CUSTOM PROCEDURE TRAY) ×2 IMPLANT
PAD ARMBOARD 7.5X6 YLW CONV (MISCELLANEOUS) ×2 IMPLANT
SET BASIN LINEN APH (SET/KITS/TRAYS/PACK) ×2 IMPLANT
SPONGE GAUZE 4X4 12PLY (GAUZE/BANDAGES/DRESSINGS) ×2 IMPLANT
STRIP CLOSURE SKIN 1/2X4 (GAUZE/BANDAGES/DRESSINGS) ×2 IMPLANT
SUT ETHILON 3 0 FSL (SUTURE) IMPLANT
SUT PROLENE 4 0 PS 2 18 (SUTURE) IMPLANT
SUT SILK 2 0 (SUTURE) ×1
SUT SILK 2-0 18XBRD TIE 12 (SUTURE) ×1 IMPLANT
SUT VIC AB 3-0 SH 27 (SUTURE)
SUT VIC AB 3-0 SH 27X BRD (SUTURE) IMPLANT
SUT VIC AB 4-0 PS2 27 (SUTURE) ×4 IMPLANT
SYR CONTROL 10ML LL (SYRINGE) ×2 IMPLANT
TAPE CLOTH SURG 4X10 WHT LF (GAUZE/BANDAGES/DRESSINGS) ×2 IMPLANT
TOWEL OR 17X26 4PK STRL BLUE (TOWEL DISPOSABLE) ×2 IMPLANT
WATER STERILE IRR 1000ML POUR (IV SOLUTION) ×6 IMPLANT

## 2013-04-09 NOTE — Progress Notes (Signed)
32 yr old Hispanic female who has a recurrent endometrioma for excision.  Procedure and risks explained in Spanish and they are aware that this could recur. Pt. Has new area of pain near C section scar.  Old operative site is asymptomatic.  Labs reviewed. Informed consent obtained.  No clinical change in H&P,   Filed Vitals:   04/09/13 0833  Pulse: 82  Temp: 97.6 F (36.4 C)  Resp: 22  BP 109/73  O2 sat 99%.  resp 15.

## 2013-04-09 NOTE — Anesthesia Preprocedure Evaluation (Signed)
Anesthesia Evaluation  Patient identified by MRN, date of birth, ID band Patient awake    Reviewed: Allergy & Precautions, H&P , NPO status , Patient's Chart, lab work & pertinent test results  History of Anesthesia Complications Negative for: history of anesthetic complications  Airway Mallampati: II  Neck ROM: Full    Dental  (+) Teeth Intact   Pulmonary neg pulmonary ROS,  breath sounds clear to auscultation        Cardiovascular negative cardio ROS  Rhythm:Regular     Neuro/Psych    GI/Hepatic   Endo/Other    Renal/GU      Musculoskeletal   Abdominal   Peds  Hematology   Anesthesia Other Findings   Reproductive/Obstetrics                           Anesthesia Physical Anesthesia Plan  ASA: I  Anesthesia Plan: General   Post-op Pain Management:    Induction: Intravenous  Airway Management Planned: LMA  Additional Equipment:   Intra-op Plan:   Post-operative Plan: Extubation in OR  Informed Consent: I have reviewed the patients History and Physical, chart, labs and discussed the procedure including the risks, benefits and alternatives for the proposed anesthesia with the patient or authorized representative who has indicated his/her understanding and acceptance.     Plan Discussed with:   Anesthesia Plan Comments:         Anesthesia Quick Evaluation

## 2013-04-09 NOTE — Progress Notes (Signed)
Post OP Check  Awake and alert.  Dressings dry and in tact. Discharge and follow up arranged.  Doing well post op.  Filed Vitals:   04/09/13 1100  BP: 112/69  Pulse: 92  Temp:   Resp: 13  temp 97.8

## 2013-04-09 NOTE — Brief Op Note (Signed)
04/09/2013  10:47 AM  PATIENT:  [name]  32 y.o. female  PRE-OPERATIVE DIAGNOSIS:  endometriosis implant  POST-OPERATIVE DIAGNOSIS:  recurrent endometrioma  PROCEDURE:  Procedure(s): EXCISION OF ENDOMETRIOMA (N/A)  SURGEON:  Surgeon(s) and Role:    * Marlane Hatcher, MD - Primary  PHYSICIAN ASSISTANT:   ASSISTANTS: none   ANESTHESIA:   general  EBL:  Total I/O In: 500 [I.V.:500] Out: -   BLOOD ADMINISTERED:none  DRAINS: none   LOCAL MEDICATIONS USED:  MARCAINE  0.5% ~ 20 cc.   SPECIMEN:  Source of Specimen:  Subcut. tissue below C section scar,( endometrioma wide excision.)  DISPOSITION OF SPECIMEN:  PATHOLOGY  COUNTS:  YES  TOURNIQUET:  * No tourniquets in log *  DICTATION: .Other Dictation: Dictation Number )R dict. # [id number].  PLAN OF CARE: Discharge to home after PACU  PATIENT DISPOSITION:  PACU - hemodynamically stable.   Delay start of Pharmacological VTE agent (>24hrs) due to surgical blood loss or risk of bleeding: not applicable
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
## 2013-04-09 NOTE — Transfer of Care (Signed)
Immediate Anesthesia Transfer of Care Note  Patient: [name]  Procedure(s) Performed: Procedure(s): EXCISION OF ENDOMETRIOMA (N/A)  Patient Location: PACU  Anesthesia Type:General  Level of Consciousness: awake and patient cooperative  Airway & Oxygen Therapy: Patient Spontanous Breathing and Patient connected to face mask oxygen  Post-op Assessment: Report given to PACU RN, Post -op Vital signs reviewed and stable and Patient moving all extremities  Post vital signs: Reviewed and stable  Complications: No apparent anesthesia complications

## 2013-04-09 NOTE — Anesthesia Postprocedure Evaluation (Signed)
  Anesthesia Post-op Note  Patient: [name]  Procedure(s) Performed: Procedure(s): EXCISION OF ENDOMETRIOMA (N/A)  Patient Location: PACU  Anesthesia Type:General  Level of Consciousness: awake, alert , oriented and patient cooperative  Airway and Oxygen Therapy: Patient Spontanous Breathing  Post-op Pain: 2 /10, mild  Post-op Assessment: Post-op Vital signs reviewed, Patient's Cardiovascular Status Stable, Respiratory Function Stable, Patent Airway, No signs of Nausea or vomiting and Pain level controlled  Post-op Vital Signs: Reviewed and stable  Complications: No apparent anesthesia complications

## 2013-04-09 NOTE — Anesthesia Procedure Notes (Signed)
Procedure Name: LMA Insertion Date/Time: 04/09/2013 9:40 AM Performed by: Despina Hidden Pre-anesthesia Checklist: Emergency Drugs available, Patient identified, Suction available and Patient being monitored Patient Re-evaluated:Patient Re-evaluated prior to inductionOxygen Delivery Method: Circle system utilized Preoxygenation: Pre-oxygenation with 100% oxygen Intubation Type: IV induction Ventilation: Mask ventilation without difficulty LMA: LMA inserted LMA Size: 3.0 Tube type: Oral Number of attempts: 1 Placement Confirmation: positive ETCO2 and breath sounds checked- equal and bilateral Tube secured with: Tape Dental Injury: Teeth and Oropharynx as per pre-operative assessment

## 2013-04-11 ENCOUNTER — Encounter (HOSPITAL_COMMUNITY): Payer: Self-pay | Admitting: General Surgery

## 2013-10-25 ENCOUNTER — Encounter: Payer: Self-pay | Admitting: Gynecology

## 2013-10-25 ENCOUNTER — Ambulatory Visit (INDEPENDENT_AMBULATORY_CARE_PROVIDER_SITE_OTHER): Payer: Self-pay | Admitting: Gynecology

## 2013-10-25 ENCOUNTER — Ambulatory Visit (INDEPENDENT_AMBULATORY_CARE_PROVIDER_SITE_OTHER): Payer: Self-pay

## 2013-10-25 ENCOUNTER — Other Ambulatory Visit: Payer: Self-pay | Admitting: Gynecology

## 2013-10-25 ENCOUNTER — Other Ambulatory Visit (HOSPITAL_COMMUNITY)
Admission: RE | Admit: 2013-10-25 | Discharge: 2013-10-25 | Disposition: A | Discharge status: Home / Self Care | Payer: Self-pay | Source: Ambulatory Visit | Attending: Gynecology | Admitting: Gynecology

## 2013-10-25 VITALS — BP 122/78 | Ht 64.0 in | Wt 122.0 lb

## 2013-10-25 DIAGNOSIS — N925 Other specified irregular menstruation: Secondary | ICD-10-CM

## 2013-10-25 DIAGNOSIS — Z124 Encounter for screening for malignant neoplasm of cervix: Secondary | ICD-10-CM

## 2013-10-25 DIAGNOSIS — N806 Endometriosis in cutaneous scar: Secondary | ICD-10-CM

## 2013-10-25 DIAGNOSIS — R102 Pelvic and perineal pain: Secondary | ICD-10-CM

## 2013-10-25 DIAGNOSIS — Z01419 Encounter for gynecological examination (general) (routine) without abnormal findings: Secondary | ICD-10-CM | POA: Insufficient documentation

## 2013-10-25 DIAGNOSIS — IMO0002 Reserved for concepts with insufficient information to code with codable children: Secondary | ICD-10-CM

## 2013-10-25 DIAGNOSIS — N938 Other specified abnormal uterine and vaginal bleeding: Secondary | ICD-10-CM

## 2013-10-25 DIAGNOSIS — Z1151 Encounter for screening for human papillomavirus (HPV): Secondary | ICD-10-CM | POA: Insufficient documentation

## 2013-10-25 DIAGNOSIS — N949 Unspecified condition associated with female genital organs and menstrual cycle: Secondary | ICD-10-CM

## 2013-10-25 DIAGNOSIS — R1900 Intra-abdominal and pelvic swelling, mass and lump, unspecified site: Secondary | ICD-10-CM

## 2013-10-25 MED ORDER — MEDROXYPROGESTERONE ACETATE 150 MG/ML IM SUSP
150.0000 mg | Freq: Once | INTRAMUSCULAR | Status: AC
  Administered 2013-10-25: 150 mg via INTRAMUSCULAR

## 2013-10-25 NOTE — Patient Instructions (Signed)
Medroxyprogesterone injection [Contraceptive] Qu es este medicamento? Las inyecciones anticonceptivas de MEDROXIPROGESTERONA previenen Water quality scientist. Las [employer] brindarn control de la natalidad durante 3 meses. La Depo-subQ Provera 104 se utiliza tambin para tratar [employer] Foods relacionado con endometriosis. Este medicamento puede ser utilizado para otros usos; si tiene alguna pregunta consulte con su proveedor de atencin mdica o con su farmacutico. MARCAS COMERCIALES DISPONIBLES: Depo-Provera, Depo-subQ Provera 104 Qu le debo informar a mi profesional de la salud antes de tomar este medicamento? Necesita saber si usted presenta alguno de los siguientes problemas o situaciones: -si consume alcohol con frecuencia -asma -enfermedad vascular o antecedente de cogulos sanguneos en los pulmones o las piernas -enfermedad de los [place], como osteoporosis -cncer de mama -diabetes -trastornos de la alimentacin (anorexia nerviosa o bulimia) -alta presin sangunea -infecciones por VIH o SIDA -enfermedad renal -enfermedad heptica -depresin mental -migraa -convulsiones -derrame cerebral -fuma tabaco -sangrado vaginal -una reaccin alrgica o inusual a la medroxiprogesterona, otras hormonas, otros medicamentos, alimentos, colorantes o conservantes -si est embarazada o buscando quedar embarazada -si est amamantando a un beb Cmo debo utilizar este medicamento? El anticonceptivo de Depo-Provera se inyecta por va intramuscular. La Depo-SubQ Provera 104 se inyecta por va subcutnea. Las [employer] un profesional de Technical sales engineer. Usted no puede estar embarazada antes de recibir una inyeccin. La inyeccin normalmente se aplica durante los primeros 5 das despus de comenzar un perodo menstrual o 6 semanas despus de un parto. Hable con su pediatra para informarse acerca del uso de este medicamento en nios. Puede requerir atencin especial. Estas inyecciones han sido usadas en nias  que han empezados a tener perodos [place]. Sobredosis: Pngase en contacto inmediatamente con un centro toxicolgico o una sala de urgencia si usted cree que haya tomado demasiado medicamento. ATENCIN: [employer] es solo para usted. No comparta este medicamento con nadie. Qu sucede si me olvido de una dosis? Trate de no olvidar ninguna dosis. Para mantener el control de natalidad necesitar una inyeccin cada 3 meses. Si no puede asistir a una cita, comunquese con su profesional de la salud para que se la [place]. Si espera ms de 13 semanas entre las inyecciones anticonceptivos de Depo-Provera o ms de 14 semanas entre inyecciones anticonceptivos de Depo-subQ Provera 104, puede quedarse [place]. Si no puede asistir a su cita utilice otro mtodo anticonceptivo. Tal vez deba hacerse una prueba de embarazo antes de recibir otra inyeccin. Qu puede interactuar con este medicamento? No tome esta medicina con ninguno de los siguientes medicamentos: -bosentano Esta medicina tambin puede interactuar con los siguientes medicamentos: -aminoglutethimide -antibiticos o medicamentos para infecciones, especialmente rifampicina, rifabutina, rifapentina y griseofulvina -aprepitant -barbitricos, tales como el fenobarbital o primidona -bexaroteno -carbamazepina -medicamentos para convulsiones, tales como etotona, felbamato, [place], [place], topiramato -modafinilo -hierba de [place] Puede ser que esta lista no menciona todas las posibles interacciones. Informe a su profesional de [employer]	[place] de [employer] productos a base de hierbas, medicamentos de [place] o suplementos nutritivos que est tomando. Si usted fuma, consume bebidas alcohlicas o si utiliza drogas ilegales, indqueselo tambin a su profesional de [employer]	[place]. Algunas sustancias pueden interactuar con su medicamento. A qu debo estar atento al usar [employer]? Este medicamento no la protege de la infeccin por VIH  (SIDA) ni de otras enfermedades de transmisin sexual. El uso de este producto puede provocar una prdida de calcio de sus huesos. La prdida de calcio puede provocar huesos dbiles (osteoporosis). Slo use este producto durante ms de 2 aos si otras formas  de anticonceptivos no son apropiados para usted. Mientre ms tiempo use este producto para el control de la natalidad, tendr ms riesgo de Marine scientistpadecer de NiSourcehuesos dbiles. Consulte a su profesional de Contractorla salud acerca de cmo puede Big Lotsmantener los huesos fuertes. Puede experimentar un cambio en el patrn de sangrado o periodos irregulares. Muchas mujeres dejan de tener periodos Goodyear Tiremientras usan este medicamento. Si recibe sus inyecciones a tiempo, la posibilidad de quedarse embarazada es muy baja. Si cree que podr AES Corporationestar embarazada, visite a su profesional de la salud lo antes posible. Si desea quedar embarazada dentro del prximo ao, informe a su profesional de Beazer Homesla salud. El Myrtleefecto de este medicamento puede perdurar durante mucho tiempo despus de recibir su ltima inyeccin. Qu efectos secundarios puedo tener al Boston Scientificutilizar este medicamento? Efectos secundarios que debe informar a su mdico o a Producer, television/film/videosu profesional de la salud tan pronto como sea posible: -Therapist, artreacciones alrgicas como erupcin cutnea, picazn o urticarias, hinchazn de la cara, labios o lengua -secreciones o sensibilidad de las mamas -problemas respiratorios -cambios en la visin -depresin -sensacin de desmayos o mareos, cadas -fiebre -dolor en el abdomen, pecho, entrepierna o piernas -problemas de coordinacin, del habla, al caminar -cansancio o debilidad inusual -color amarillento de los ojos o la piel Efectos secundarios que, por lo general, no requieren Psychologist, prison and probation servicesatencin mdica (debe informarlos a mdico o a Producer, television/film/videosu profesional de la salud si persisten o si son molestos): -cne -retencin de lquidos e hinchazn -dolor de cabeza -perodos menstruales irregulares, manchando o ausencia de perodos  menstruales -dolor, picazn o reaccin cutnea temporal en el lugar de la inyeccin -aumento de peso Puede ser que esta lista no menciona todos los posibles efectos secundarios. Comunquese a su mdico por asesoramiento mdico Hewlett-Packardsobre los efectos secundarios. Usted puede informar los efectos secundarios a la FDA por telfono al 1-800-FDA-1088. Dnde debo guardar mi medicina? No se aplica en este caso. Un profesional de Associate Professorla salud le administrar las inyecciones. ATENCIN: Este folleto es un resumen. Puede ser que no cubra toda la posible informacin. Si usted tiene preguntas acerca de esta medicina, consulte con su mdico, su farmacutico o su profesional de Radiographer, therapeuticla salud.  2014, Elsevier/Gold Standard. (2008-10-14 15:09:00) Endometriosis (Endometriosis) La endometriosis es una enfermedad en la que el tejido que rodea al tero (endometrio) crece fuera de su ubicacin normal. El tejido puede crecer en muchos lugares cerca del tero, pero comnmente crece en los ovarios, las trompas de [place], la vagina o el intestino. Dado que el tero expulsa o desprende su revestimiento en cada ciclo menstrual, hay sangrado en el lugar donde se localiza el tejido endometrial. Esto puede causar dolor porque la sangre es irritante para los tejidos que no estn normalmente expuestos a Acupuncturistesta.  CAUSAS  Se desconoce la causa de la endometriosis.  SIGNOS Y SNTOMAS  A menudo, no hay sntomas. Cuando se presentan sntomas, estos pueden variar segn la ubicacin del tejido desplazado. Pueden ocurrir diversos sntomas en diferentes momentos. Aunque los sntomas se producen principalmente durante el perodo menstrual de Danauna mujer, tambin pueden aparecer en la mitad del ciclo y generalmente terminan con la menopausia. Algunas personas pueden pasar meses sin experimentar ningn tipo de sntomas. Los sntomas pueden ser:   Dolor abdominal o en la espalda.  Sangrado ms abundante durante los perodos Becton, Dickinson and Companymenstruales.  Dolor durante las  The St. Paul Travelersrelaciones sexuales.  Dolor al defecar.  Infertilidad. DIAGNSTICO  El mdico le preguntar acerca de sus sntomas y le har un examen fsico. Se pueden realizar varios estudios, por ejemplo:  Anlisis de [place] y [place]. Estos se realizan para ayudar a Armed forces logistics/support/administrative officer.  Ecografas. Este estudio se realiza para observar el tejido anormal.  Ecografa de la parte inferior del intestino (enema de bario).  Laparoscopia. En este procedimiento, se inserta un tubo delgado, que emite luz y tiene una pequea cmara en el extremo (laparoscopio) dentro del abdomen. Esto ayuda a que su mdico observe el tejido anormal para confirmar el diagnstico. El mdico tambin puede quitar una pequea parte de tejido anormal (biopsia) que encuentra. Posteriormente esta muestra de tejido se enva a un laboratorio para examinarlo con un microscopio. TRATAMIENTO  El tratamiento variar y puede incluir lo siguiente:   Medicamentos para Engineer, materials. Los antiinflamatorios no esteroides Murriel Hopper) son un tipo de analgsico que pueden ayudar a Engineer, materials causado por la endometriosis.  Terapia hormonal. Cuando se use la terapia hormonal, se eliminan los perodos menstruales. Esto elimina la exposicin mensual a la sangre del tejido endometrial desplazado.  Ciruga. Algunas veces puede hacerse una ciruga para extirpar el tejido endometrial anormal. En casos graves, la ciruga puede hacerse para extirpar las trompas de [place], el tero y los ovarios (histerectoma). INSTRUCCIONES PARA EL CUIDADO EN EL HOGAR   Utilice los medicamentos de venta libre o recetados para Primary school teacher, el malestar o la [place], segn se lo indique el mdico. No tome aspirina porque puede aumentar el sangrado cuando no recibe terapia hormonal.  Evite actividades que produzcan dolor, incluida la actividad sexual. SOLICITE ATENCIN MDICA SI:  Tiene dolor plvico durante los perodos menstruales y antes y despus de  [place].  Siente dolor plvico [employer] perodos menstruales que empeora durante el perodo.  Experimenta dolor plvico durante la actividad sexual o despus de [place].  Siente dolor plvico al defecar u orinar, especialmente durante el perodo menstrual.  Tiene dificultad para quedar embarazada. SOLICITE ATENCIN MDICA DE INMEDIATO SI:   El dolor es intenso y no responde a los analgsicos.  Siente nuseas y vmitos intensos, o no puede [employer].  Tiene dolor que se limita a la parte inferior derecha del abdomen.  Presenta hinchazn o aumento del dolor en el abdomen.  Observa sangre en la materia fecal.  Tiene fiebre o sntomas persistentes durante ms de 2a 3das.  Tiene fiebre y los sntomas empeoran repentinamente. ASEGRESE DE QUE:   Comprende estas instrucciones.  Controlar su afeccin.  Recibir ayuda de inmediato si no mejora o si empeora. Document Released: 08/02/2005 Document Revised: 05/23/2013 [facility] Patient Information 2014 [place], [place].

## 2013-10-25 NOTE — Progress Notes (Signed)
   Patient is a 33 year old gravida 2 para 2 (2 prior cesarean sections using condoms for contraception presented to the office as a result of having menses twice a month for the past 2 months. Patient also complaining of tenderness and swelling at the C-section scar site. Patient brought records with her and her history as follows:  General surgeon Dr. Barbaraann BarthelWilliam Bradford had operated on this patient twice in 2013 in 2014 whereby point of tenderness at the Pfannenstiel incision was noted to have endometriomas that were excised twice and confirmed by pathology report. Patient was only given Provera 10 mg for 30 days. And has been on no medication currently. She does have dyspareunia at times as well.  CT scan of the abdomen done in June 2014 with the following impression reported by radiologist:  IMPRESSION:  3.4 x 2.0 x 2.0 cm diameter area of enhancing soft tissue at the  inferior aspect of the right rectus abdominal muscle just above the  patients cesarean section scar.  Although the CT appearance is nonspecific, this may represent an  additional endometriosis implant of the anterior abdominal wall  though other tumors are not excluded.  Small amount of nonspecific free intrapelvic fluid.   Exam today: Abdomen soft slightly tender midportion of Pfannenstiel scar scarring effect versus subcutaneous flocculent/or indurated area from previous surgery? Pelvic exam: Bartholin urethra Skene was within normal limits Vagina: Some slight menstrual blood was noted Cervix: No lesions or discharge Uterus anteverted normal size shape and consistency Adnexa: No palpable masses or tenderness Rectal exam: Not done  Patient was tender in the cul-de-sac area when proctoswab was applied to the area. Pap smear was done today  Ultrasound today:  Uterus measures 7.5 x 6.1 x 4.1 cm with endometrial stripe of 6.2 mm. Retroverted uterus with a small intramural myoma measuring 8 mm was noted. Right ovary was  normal strut about some free fluid. Left ovary was normal. No apparent masses were noted on either adnexa. A solid mass midline cesarean section incision site avascular measuring 40 x 15 x 29 mm.  Assessment/plan: Patient with apparent recurrent incisional endometriosis. We discussed options to include [place] and removal of endometrioma with possibility of the need for graft? We also discussed a trial of Depo-Provera which is used for endometriosis and then reassess in 3 months. She states her symptoms are worse right before and during her menses. She would like to try this approach first. She will be prescribed Depo-Provera 150 mg IM every 3 months. She'll return back in 3 months and we will reassess and scan the area to see if it has been improvement with injection. If not or her symptoms worsen before her second injection she will return back to the office and we were refer to the general surgeon for exploration. Literature and information was provided in [place].

## 2014-01-22 ENCOUNTER — Encounter: Payer: Self-pay | Admitting: Gynecology

## 2014-01-22 ENCOUNTER — Ambulatory Visit (INDEPENDENT_AMBULATORY_CARE_PROVIDER_SITE_OTHER): Payer: Self-pay | Admitting: Gynecology

## 2014-01-22 VITALS — BP 112/70

## 2014-01-22 DIAGNOSIS — N806 Endometriosis in cutaneous scar: Secondary | ICD-10-CM

## 2014-01-22 DIAGNOSIS — N921 Excessive and frequent menstruation with irregular cycle: Secondary | ICD-10-CM

## 2014-01-22 MED ORDER — ESTRADIOL 1 MG PO TABS
ORAL_TABLET | ORAL | Status: DC
Start: 2014-01-22 — End: 2014-08-26

## 2014-01-22 MED ORDER — MEDROXYPROGESTERONE ACETATE 150 MG/ML IM SUSP
150.0000 mg | Freq: Once | INTRAMUSCULAR | Status: AC
  Administered 2014-01-22: 150 mg via INTRAMUSCULAR

## 2014-01-22 NOTE — Progress Notes (Signed)
   Patient presents to the office today for 3 months followup after initiating Depo-Provera injection as a result of history of endometriosis that C-section scar site. Her history is as follows:  Development worker, international aid Dr. Barbaraann Barthel had operated on this patient twice in 2013 in 2014 whereby point of tenderness at the Pfannenstiel incision was noted to have endometriomas that were excised twice and confirmed by pathology report. Patient was only given Provera 10 mg for 30 days. And has been on no medication currently.   CT scan of the abdomen June 2014 report the following: 3.4 x 2.0 x 2.0 cm diameter area of enhancing soft tissue at the  inferior aspect of the right rectus abdominal muscle just above the  patients cesarean section scar.  Although the CT appearance is nonspecific, this may represent an  additional endometriosis implant of the anterior abdominal wall  though other tumors are not excluded.  Small amount of nonspecific free intrapelvic fluid.  Ultrasound in the office 10/25/2013 demonstrated the following: Uterus measures 7.5 x 6.1 x 4.1 cm with endometrial stripe of 6.2 mm. Retroverted uterus with a small intramural myoma measuring 8 mm was noted. Right ovary was normal strut about some free fluid. Left ovary was normal. No apparent masses were noted on either adnexa. A solid mass midline cesarean section incision site avascular measuring 40 x 15 x 29 mm.  Patient feels much better since the injection and nontender but has had spotting since she's been on the Depo-Provera injection and has complained of some decrease sex drive.  Exam: Abdomen: Soft nontender no rebound or guarding Pelvic: Bartholin urethra Skene was within normal limits Vagina with some menstrual blood present Cervix: No gross lesions on inspection Uterus: Anteverted normal size shape and consistency nontender Adnexa: No palpable mass or tenderness Rectal exam: Not done  Assessment/plan: Patient with  recurrent incisional endometriosis has responded well to first injection of Depo-Provera 150 mg IM. Patient will receive her second dose today. To help with some of her breakthrough bleeding she'll be placed on Estrace 1 mg one by mouth daily for 10 days of the month for the next 3 months. She'll return back to the office in 3 months for followup exam as well as for ultrasound. She was reminded to begin taking calcium and vitamin D one tablet daily. All the above was discussed with patient in Spanish.

## 2014-04-24 ENCOUNTER — Other Ambulatory Visit: Payer: Self-pay

## 2014-04-24 ENCOUNTER — Ambulatory Visit: Payer: Self-pay | Admitting: Gynecology

## 2014-06-17 ENCOUNTER — Encounter: Payer: Self-pay | Admitting: Gynecology

## 2014-08-26 ENCOUNTER — Encounter: Payer: Self-pay | Admitting: Gynecology

## 2014-08-26 ENCOUNTER — Ambulatory Visit (INDEPENDENT_AMBULATORY_CARE_PROVIDER_SITE_OTHER): Payer: Self-pay | Admitting: Gynecology

## 2014-08-26 VITALS — BP 120/76

## 2014-08-26 DIAGNOSIS — N912 Amenorrhea, unspecified: Secondary | ICD-10-CM

## 2014-08-26 DIAGNOSIS — N809 Endometriosis, unspecified: Secondary | ICD-10-CM

## 2014-08-26 MED ORDER — MEDROXYPROGESTERONE ACETATE 10 MG PO TABS: 10.0000 mg | ORAL_TABLET | Freq: Every day | ORAL | Status: DC

## 2014-08-26 MED ORDER — NORETHINDRONE 0.35 MG PO TABS: 1.0000 | ORAL_TABLET | Freq: Every day | ORAL | Status: DC

## 2014-08-26 NOTE — Progress Notes (Signed)
    patient is a gravida 2 para 2 (2 prior cesarean sections) who presented to the office today with complaint of having skipping cycles in November and December and had a menstrual cycle from the first through January 5 of this year. Patient's last Depo-Provera injection was in July 2015. Her history is as follows:  Development worker, international aidGeneral surgeon Dr. Barbaraann BarthelWilliam Bradford had operated on this patient twice in 2013 in 2014 whereby point of tenderness at the Pfannenstiel incision was noted to have endometriomas that were excised twice and confirmed by pathology report. Patient was only given Provera 10 mg for 30 days. And has been on no medication currently. She does have dyspareunia at times as well.  CT scan of the abdomen done in June 2014 with the following impression reported by radiologist:  IMPRESSION:  3.4 x 2.0 x 2.0 cm diameter area of enhancing soft tissue at the  inferior aspect of the right rectus abdominal muscle just above the  patients cesarean section scar.  Although the CT appearance is nonspecific, this may represent an  additional endometriosis implant of the anterior abdominal wall  though other tumors are not excluded.  Small amount of nonspecific free intrapelvic fluid.  Ultrasound done in our office on 10/25/2013 as follows: Uterus measures 7.5 x 6.1 x 4.1 cm with endometrial stripe of 6.2 mm. Retroverted uterus with a small intramural myoma measuring 8 mm was noted. Right ovary was normal strut about some free fluid. Left ovary was normal. No apparent masses were noted on either adnexa. A solid mass midline cesarean section incision site avascular measuring 40 x 15 x 29 mm.  On that office visit we had discussed options to include referral to Gen[place] for a wider excision and removal of endometrioma with possibility of the need for graft? We also discussed a trial of Depo-Provera which is used for endometriosis and then reassess in 3 months. She states her  symptoms are worse right before and during her menses. She decided to go with the Depo-Provera.. Her symptoms resolved but she was spotting since she was on the Depo-Provera and did not continue the injections after July of this year. Patient stated her cycles are return back to normal now and her pains are only turned at time of her cycle. She reports no dysmenorrhea.  Exam: Abdomen soft slightly tender midportion of Pfannenstiel scar scarring effect versus subcutaneous flocculent/or indurated area from previous surgery? Bartholin urethra Skene was within normal limits Vagina: No lesions or discharge Cervix no lesions or discharge Uterus anteverted normal size shape and consistency Adnexa: No palpable masses or tenderness Rectal exam: Not done  Assessment/plan: Patient with history of endometriosis involving previous Pfannenstiel scars with previous resection by general surgeon 2. Patient seemed to have responded well to the Depo-Provera injection. Patient stated since she's been off of it she feels fine and will return back to the office in April for an ultrasound of the lower abdomen and pelvis. She will take Advil for her dysmenorrhea which she states has helped. Her husband we'll continue to use condoms for contraception. We had provided her with option to take progesterone only oral contraceptive pill but she states that she has compliance issue and wants to monitor for now. Her urine pregnancy test was negative.

## 2014-08-26 NOTE — Addendum Note (Signed)
Addended by: Berna SpareASTILLO, BLANCA A on: 08/26/2014 04:21 PM   Modules accepted: Orders

## 2014-08-27 LAB — PREGNANCY, URINE: PREG TEST UR: NEGATIVE

## 2014-10-29 ENCOUNTER — Emergency Department (HOSPITAL_COMMUNITY)
Admission: EM | Admit: 2014-10-29 | Discharge: 2014-10-30 | Disposition: A | Discharge status: Home / Self Care | Payer: Self-pay | Attending: Emergency Medicine | Admitting: Emergency Medicine

## 2014-10-29 ENCOUNTER — Encounter (HOSPITAL_COMMUNITY): Payer: Self-pay | Admitting: *Deleted

## 2014-10-29 DIAGNOSIS — R112 Nausea with vomiting, unspecified: Secondary | ICD-10-CM | POA: Insufficient documentation

## 2014-10-29 DIAGNOSIS — Z880 Allergy status to penicillin: Secondary | ICD-10-CM | POA: Insufficient documentation

## 2014-10-29 DIAGNOSIS — R6883 Chills (without fever): Secondary | ICD-10-CM | POA: Insufficient documentation

## 2014-10-29 DIAGNOSIS — Z3202 Encounter for pregnancy test, result negative: Secondary | ICD-10-CM | POA: Insufficient documentation

## 2014-10-29 DIAGNOSIS — R109 Unspecified abdominal pain: Secondary | ICD-10-CM | POA: Insufficient documentation

## 2014-10-29 DIAGNOSIS — Z8742 Personal history of other diseases of the female genital tract: Secondary | ICD-10-CM | POA: Insufficient documentation

## 2014-10-29 LAB — COMPREHENSIVE METABOLIC PANEL
ALBUMIN: 4.5 g/dL (ref 3.5–5.2)
ALK PHOS: 69 U/L (ref 39–117)
ALT: 15 U/L (ref 0–35)
AST: 18 U/L (ref 0–37)
Anion gap: 9 (ref 5–15)
BUN: 7 mg/dL (ref 6–23)
CO2: 23 mmol/L (ref 19–32)
CREATININE: 0.46 mg/dL — AB (ref 0.50–1.10)
Calcium: 9 mg/dL (ref 8.4–10.5)
Chloride: 105 mmol/L (ref 96–112)
GFR calc Af Amer: >90 mL/min (ref >90)
GFR calc non Af Amer: >90 mL/min (ref >90)
Glucose, Bld: 96 mg/dL (ref 70–99)
POTASSIUM: 3.6 mmol/L (ref 3.5–5.1)
Sodium: 137 mmol/L (ref 135–145)
TOTAL PROTEIN: 7.8 g/dL (ref 6.0–8.3)
Total Bilirubin: 0.9 mg/dL (ref 0.3–1.2)

## 2014-10-29 LAB — CBC
HCT: 40.5 % (ref 36.0–46.0)
HEMOGLOBIN: 14.1 g/dL (ref 12.0–15.0)
MCH: 30.4 pg (ref 26.0–34.0)
MCHC: 34.8 g/dL (ref 30.0–36.0)
MCV: 87.3 fL (ref 78.0–100.0)
Platelets: 190 10*3/uL (ref 150–400)
RBC: 4.64 MIL/uL (ref 3.87–5.11)
RDW: 11.8 % (ref 11.5–15.5)
WBC: 11.8 10*3/uL — AB (ref 4.0–10.5)

## 2014-10-29 LAB — URINALYSIS, ROUTINE W REFLEX MICROSCOPIC
BILIRUBIN URINE: NEGATIVE
Glucose, UA: NEGATIVE mg/dL
HGB URINE DIPSTICK: NEGATIVE
KETONES UR: NEGATIVE mg/dL
Leukocytes, UA: NEGATIVE
NITRITE: NEGATIVE
PROTEIN: NEGATIVE mg/dL
Specific Gravity, Urine: 1.017 (ref 1.005–1.030)
UROBILINOGEN UA: 1 mg/dL (ref 0.0–1.0)
pH: 8 (ref 5.0–8.0)

## 2014-10-29 LAB — POC URINE PREG, ED: Preg Test, Ur: NEGATIVE

## 2014-10-29 MED ORDER — MORPHINE SULFATE 4 MG/ML IJ SOLN
4.0000 mg | Freq: Once | INTRAMUSCULAR | Status: AC
  Administered 2014-10-29: 4 mg via INTRAVENOUS
  Filled 2014-10-29: qty 1

## 2014-10-29 MED ORDER — ONDANSETRON HCL 4 MG/2ML IJ SOLN
4.0000 mg | Freq: Once | INTRAMUSCULAR | Status: AC
  Administered 2014-10-29: 4 mg via INTRAVENOUS
  Filled 2014-10-29: qty 2

## 2014-10-29 NOTE — ED Provider Notes (Signed)
CSN: [id number]     Arrival date & time 10/29/14  1906 History   First MD Initiated Contact with Patient 10/29/14 2100     Chief Complaint  Patient presents with  . Flank Pain     (Consider location/radiation/quality/duration/timing/severity/associated sxs/prior Treatment) Patient is a 34 y.o. female presenting with flank pain. The history is provided by the patient. A language interpreter was used.  Flank Pain This is a new problem. The current episode started in the past 7 days. The problem occurs constantly. The problem has been gradually worsening. Associated symptoms include abdominal pain, chills, nausea and vomiting. The symptoms are aggravated by drinking and eating.    Past Medical History  Diagnosis Date  . No pertinent past medical history   . PONV (postoperative nausea and vomiting)   . Endometriosis in scar of skin    Past Surgical History  Procedure Laterality Date  . Laparotomy  11/15/2011    Procedure: EXPLORATORY LAPAROTOMY;  Surgeon: Marlane HatcherWilliam S Bradford, MD;  Location: [facility];  Service: General;  Laterality: N/A;  Exploration and Excision of Endometrial Implant  . Mass excision N/A 04/09/2013    Procedure: EXCISION OF ENDOMETRIOMA;  Surgeon: Marlane HatcherWilliam S Bradford, MD;  Location: [facility];  Service: General;  Laterality: N/A;  . Cesarean section      X2   Family History  Problem Relation Age of Onset  . Diabetes Mother   . Cancer Mother     UTERINE   . Cancer Brother     LEUKEMIA  . Anesthesia problems Neg Hx   . Hypotension Neg Hx   . Malignant hyperthermia Neg Hx   . Pseudochol deficiency Neg Hx    History  Substance Use Topics  . Smoking status: Never Smoker   . Smokeless tobacco: Not on file  . Alcohol Use: No   OB History    Gravida Para Term Preterm AB TAB SAB Ectopic Multiple Living   2 2 1 1  0 0 0 0 0 2     Review of Systems  Constitutional: Positive for chills.  Gastrointestinal: Positive for nausea, vomiting and abdominal pain.  Genitourinary:  Positive for flank pain.  All other systems reviewed and are negative.     Allergies  Penicillins and Tylenol  Home Medications   Prior to Admission medications   Medication Sig Start Date End Date Taking? Authorizing Provider  ibuprofen (ADVIL,MOTRIN) 200 MG tablet Take 200 mg by mouth every 6 (six) hours as needed for headache or moderate pain.   Yes Historical Provider, MD   BP 100/52 mmHg  Pulse 112  Temp(Src) 98.1 F (36.7 C) (Oral)  Resp 18  SpO2 99%  LMP 10/15/2014 Physical Exam  Constitutional: She is oriented to person, place, and time. She appears well-developed. She appears distressed.  HENT:  Head: Normocephalic and atraumatic.  Nose: Nose normal.  Eyes: Conjunctivae are normal. Pupils are equal, round, and reactive to light.  Neck: Neck supple.  Cardiovascular: Normal heart sounds and intact distal pulses.   Pulmonary/Chest: Effort normal and breath sounds normal.  Abdominal: Soft. Bowel sounds are normal.  Musculoskeletal: She exhibits no edema or tenderness.  Lymphadenopathy:    She has no cervical adenopathy.  Neurological: She is alert and oriented to person, place, and time.  Skin: Skin is warm and dry.  Psychiatric: She has a normal mood and affect.  Nursing note and vitals reviewed.   ED Course  Procedures (including critical care time)  11:57 PM  Pelvic exam completed.  No bleeding. Normal physiologic fluid.  No CMT.  Mild midline tenderness to palpation. No adnexal tenderness.  Labs Review Labs Reviewed  CBC  COMPREHENSIVE METABOLIC PANEL  URINALYSIS, ROUTINE W REFLEX MICROSCOPIC  POC URINE PREG, ED    Imaging Review No results found.   EKG Interpretation None     Lab and radiology results reviewed, shared with patient.  No acute findings on abdominal CT. No UTI.  Patient to follow-up with her PCP.  Return precautions provided. MDM   Final diagnoses:  None    Abdominal pain.    Felicie Morn, NP 10/30/14 4540  Glynn Octave, MD 10/30/14 [phone]

## 2014-10-29 NOTE — ED Notes (Signed)
Pt states that she began having right flank pain Sunday morning; pt states that the pain has gotten progressively worse today; pt states that she vomited from the pain approx an hour ago; pt states that the pain radiates around to lower abd this afternoon; pt denies urinary frequency, urgency or burning

## 2014-10-30 ENCOUNTER — Emergency Department (HOSPITAL_COMMUNITY): Payer: Self-pay

## 2014-10-30 LAB — WET PREP, GENITAL
Clue Cells Wet Prep HPF POC: NONE SEEN
TRICH WET PREP: NONE SEEN
WBC, Wet Prep HPF POC: NONE SEEN
Yeast Wet Prep HPF POC: NONE SEEN

## 2014-10-30 LAB — GC/CHLAMYDIA PROBE AMP (~~LOC~~) NOT AT ARMC
CHLAMYDIA, DNA PROBE: NEGATIVE
Neisseria Gonorrhea: NEGATIVE

## 2014-10-30 MED ORDER — OXYCODONE HCL 5 MG PO TABS
5.0000 mg | ORAL_TABLET | Freq: Four times a day (QID) | ORAL | Status: DC | PRN
Start: 2014-10-30 — End: 2016-01-05

## 2014-10-30 NOTE — Discharge Instructions (Signed)
Dolor abdominal en las mujeres °(Abdominal Pain, Women) °El dolor abdominal (en el estómago, la pelvis o el vientre) puede tener muchas causas. Es importante que le informe a su médico: °· La ubicación del dolor. °· ¿Viene y se va, o persiste todo el tiempo? °· ¿Hay situaciones que inician el dolor (comer ciertos alimentos, la actividad física)? °· ¿Tiene otros síntomas asociados al dolor (fiebre, náuseas, vómitos, diarrea)? °Todo es de gran ayuda cuando se trata de hallar la causa del dolor. °CAUSAS °· Estómago: Infecciones por virus o bacterias, o úlcera. °· Intestino: Apendicitis (apéndice inflamado), ileitis regional (enfermedad de Crohn), colitis ulcerosa (colon inflamado), síndrome del colon irritable, diverticulitis (inflamación de los divertículos del colon) o cáncer de estómago oo intestino. °· Enfermedades de la vesícula biliar o cálculos. °· Enfermedades renales, cálculos o infecciones en el riñón. °· Infección o cáncer del páncreas. °· Fibromialgia (trastorno doloroso) °· Enfermedades de los órganos femeninos: °¨ Uterus: Útero: fibroma (tumor no canceroso) o infección °¨ Trompas de Falopio: infección o embarazo ectópico °¨ En los ovarios, quistes o tumores. °¨ Adherencias pélvicas (tejido cicatrizal). °¨ Endometriosis (el tejido que cubre el útero se desarrolla en la pelvis y los órganos pélvicos). °¨ Síndrome de congestión pélvica (los órganos femeninos se llenan de sangre antes del periodo menstrual( °¨ Dolor durante el periodo menstrual. °¨ Dolor durante la ovulación (al producir óvulos). °¨ Dolor al usar el DIU (dispositivo intrauterino para el control de la natalidad) °¨ Cáncer en los órganos femeninos. °· Dolor funcional (no está originado en una enfermedad, puede mejorar sin tratamiento). °· Dolor de origen psicológico °· Depresión. °DIAGNÓSTICO °Su médico decidirá la gravedad del dolor a través del examen físico °· Análisis de sangre °· Radiografías °· Ecografías °· TC (tomografía computada, tipo  especial de radiografías). °· IMR (resonancia magnética) °· Cultivos, en el caso una infección °· Colon por enema de bario (se inserta una sustancia de contraste en el intestino grueso para mejorar la observación con rayos X.) °· Colonoscopía (observación del intestino con un tubo luminoso). °· Laparoscopía (examen del interior del abdomen con un tubo que tiene una luz). °· Cirugía exploratoria abdominal mayor (se observa el abdomen realizando una gran incisión). °TRATAMIENTO °El tratamiento dependerá de la causa del problema.  °· Muchos de estos casos pueden controlarse y tratarse en casa. °· Medicamentos de venta libre indicados por el médico. °· Medicamentos con receta. °· Antibióticos, en caso de infección °· Píldoras anticonceptivas, en el caso de períodos dolorosos o dolor al ovular. °· Tratamiento hormonal, para la endometriosis °· Inyecciones para bloqueo nervioso selectivo. °· Fisioterapia. °· Antidepresivos. °· Consejos por parte de un psícólogo o psiquiatra. °· Cirugía mayor o menor. °INSTRUCCIONES PARA EL CUIDADO DOMICILIARIO °· No tome ni administre laxantes a menos que se lo haya indicado su médico. °· Tome analgésicos de venta libre sólo si se lo ha indicado el profesional que lo asiste. No tome aspirina, ya que puede causar molestias en el estómago o hemorragias. °· Consuma una dieta líquida (caldo o agua) según lo indicado por el médico. Progrese lentamente a una dieta blanda, según la tolerancia, si el dolor se relaciona con el estómago o el intestino. °· Tenga un termómetro y tómese la temperatura varias veces al día. °· Haga reposo en la cama y duerma, si esto alivia el dolor. °· Evite las relaciones sexuales, si le producen dolor. °· Evite las situaciones estresantes. °· Cumpla con las visitas y los análisis de control, según las indicaciones de su médico. °· Si el dolor   no se alivia con los medicamentos o la cirugía, puede tratar con: °¨ Acupuntura. °¨ Ejercicios de relajación (yoga,  meditación). °¨ Terapia grupal. °¨ Psicoterapia. °SOLICITE ATENCIÓN MÉDICA SI: °· Nota que ciertos alimentos le producen dolor de estómago. °· El tratamiento indicado para realizar en el hogar no le alivia el dolor. °· Necesita analgésicos más fuertes. °· Quiere que le retiren el DIU. °· Si se siente confundido o desfalleciente. °· Presenta náuseas o vómitos. °· Aparece una erupción cutánea. °· Sufre efectos adversos o una reacción alérgica debido a los medicamentos que toma. °SOLICITE ATENCIÓN MÉDICA DE INMEDIATO SI: °· El dolor persiste o se agrava. °· Tiene fiebre. °· Siente el dolor sólo en algunos sectores del abdomen. Si se localiza en la zona derecha, posiblemente podría tratarse de apendicitis. En un adulto, si se localiza en la región inferior izquierda del abdomen, podría tratarse de colitis o diverticulitis. °· Hay sangre en las heces (deposiciones de color rojo brillante o negro alquitranado), con o sin vómitos. °· Usted presenta sangre en la orina. °· Siente escalofríos con o sin fiebre. °· Se desmaya. °ASEGÚRESE QUE:  °· Comprende estas instrucciones. °· Controlará su enfermedad. °· Solicitará ayuda de inmediato si no mejora o si empeora. °Document Released: 11/18/2008 Document Revised: 10/25/2011 °ExitCare® Patient Information ©2015 [employer]. This information is not intended to replace advice given to you by your health care provider. Make sure you discuss any questions you have with your health care provider. ° °

## 2014-11-12 ENCOUNTER — Emergency Department (HOSPITAL_COMMUNITY)
Admission: EM | Admit: 2014-11-12 | Discharge: 2014-11-12 | Discharge status: Against Medical Advice | Payer: Self-pay | Attending: Emergency Medicine | Admitting: Emergency Medicine

## 2014-11-12 ENCOUNTER — Encounter (HOSPITAL_COMMUNITY): Payer: Self-pay | Admitting: Emergency Medicine

## 2014-11-12 DIAGNOSIS — R109 Unspecified abdominal pain: Secondary | ICD-10-CM | POA: Insufficient documentation

## 2014-11-12 DIAGNOSIS — R112 Nausea with vomiting, unspecified: Secondary | ICD-10-CM | POA: Insufficient documentation

## 2014-11-12 LAB — LIPASE, BLOOD: LIPASE: 21 U/L (ref 11–59)

## 2014-11-12 LAB — CBC WITH DIFFERENTIAL/PLATELET
Basophils Absolute: 0 10*3/uL (ref 0.0–0.1)
Basophils Relative: 0 % (ref 0–1)
EOS PCT: 0 % (ref 0–5)
Eosinophils Absolute: 0 10*3/uL (ref 0.0–0.7)
HCT: 36.1 % (ref 36.0–46.0)
Hemoglobin: 12.9 g/dL (ref 12.0–15.0)
LYMPHS ABS: 0.5 10*3/uL — AB (ref 0.7–4.0)
Lymphocytes Relative: 3 % — ABNORMAL LOW (ref 12–46)
MCH: 31.3 pg (ref 26.0–34.0)
MCHC: 35.7 g/dL (ref 30.0–36.0)
MCV: 87.6 fL (ref 78.0–100.0)
Monocytes Absolute: 0.6 10*3/uL (ref 0.1–1.0)
Monocytes Relative: 3 % (ref 3–12)
Neutro Abs: 17.7 10*3/uL — ABNORMAL HIGH (ref 1.7–7.7)
Neutrophils Relative %: 94 % — ABNORMAL HIGH (ref 43–77)
Platelets: 165 10*3/uL (ref 150–400)
RBC: 4.12 MIL/uL (ref 3.87–5.11)
RDW: 12.2 % (ref 11.5–15.5)
WBC: 18.8 10*3/uL — ABNORMAL HIGH (ref 4.0–10.5)

## 2014-11-12 LAB — COMPREHENSIVE METABOLIC PANEL
ALT: 15 U/L (ref 0–35)
AST: 18 U/L (ref 0–37)
Albumin: 4.4 g/dL (ref 3.5–5.2)
Alkaline Phosphatase: 61 U/L (ref 39–117)
Anion gap: 8 (ref 5–15)
BUN: 14 mg/dL (ref 6–23)
CALCIUM: 9.1 mg/dL (ref 8.4–10.5)
CHLORIDE: 107 mmol/L (ref 96–112)
CO2: 19 mmol/L (ref 19–32)
Creatinine, Ser: 0.55 mg/dL (ref 0.50–1.10)
GFR calc Af Amer: >90 mL/min (ref >90)
GFR calc non Af Amer: >90 mL/min (ref >90)
GLUCOSE: 137 mg/dL — AB (ref 70–99)
Potassium: 3.1 mmol/L — ABNORMAL LOW (ref 3.5–5.1)
Sodium: 134 mmol/L — ABNORMAL LOW (ref 135–145)
TOTAL PROTEIN: 7.4 g/dL (ref 6.0–8.3)
Total Bilirubin: 1.1 mg/dL (ref 0.3–1.2)

## 2014-11-12 NOTE — ED Notes (Signed)
Called twice. No answer.

## 2014-11-12 NOTE — ED Notes (Signed)
Pt states that she was seen last week for flank and abdominal pain. Vomiting. Alert and oriented.

## 2014-11-12 NOTE — ED Notes (Signed)
Pt was called twice for room assignment, no response.

## 2014-11-20 ENCOUNTER — Ambulatory Visit: Payer: Self-pay | Admitting: Gynecology

## 2014-11-20 ENCOUNTER — Other Ambulatory Visit: Payer: Self-pay

## 2014-11-27 ENCOUNTER — Ambulatory Visit (INDEPENDENT_AMBULATORY_CARE_PROVIDER_SITE_OTHER): Payer: Self-pay | Admitting: Gynecology

## 2014-11-27 ENCOUNTER — Ambulatory Visit (INDEPENDENT_AMBULATORY_CARE_PROVIDER_SITE_OTHER): Payer: Self-pay

## 2014-11-27 DIAGNOSIS — N809 Endometriosis, unspecified: Secondary | ICD-10-CM

## 2014-11-27 NOTE — Progress Notes (Signed)
   Patient presented to the office for her follow-up  ultrasound. Patient does complain at times of tenderness at time of her menses at the area of her Pfannenstiel scar.General surgeon Dr. Barbaraann BarthelWilliam Bradford had operated on this patient twice in 2013 in 2014 whereby point of tenderness at the Pfannenstiel incision was noted to have endometriomas that were excised twice and confirmed by pathology report. Patient was recently seen in the emergency room with abdominal pain and had a negative CT scan.  The ultrasound done in our office on March 2015 demonstrated the following: Uterus measures 7.5 x 6.1 x 4.1 cm with endometrial stripe of 6.2 mm. Retroverted uterus with a small intramural myoma measuring 8 mm was noted. Right ovary was normal strut about some free fluid. Left ovary was normal. No apparent masses were noted on either adnexa. A solid mass midline cesarean section incision site avascular measuring 40 x 15 x 29 mm.  We had previously discussed options to include referral to Gen. surgeon [place] in [place] for a wider excision and removal of endometrioma with possibility of the need for graft? We also discussed a trial of Depo-Provera which is used for endometriosis and then reassess in 3 months. She states her symptoms are worse right before and during her menses. She decided to go with the Depo-Provera.. Her symptoms resolved and only occur during the time of her menses. Patient currently on no medication has compliance issues with the birth control pill. She's here to discuss the ultrasound.  Ultrasound today demonstrated the following: Uterus measures 7.9 x 6.6 x 4.3 cm with endometrial stripe of 6.4 mm. Both right and left ovary with normal several follicles. Small amount of fluid in the cul-de-sac 24 x 13 mm was noted. No apparent masses seen on either adnexa. The soft tissue focal area at that wants was described as measuring 4.0 x 1.5 x 2.9 cm was now measuring 1.7 x 1.4 cm.  Exam: Abdomen  soft slightly tender midportion Pfannenstiel scar area. No rebound or guarding  Assessment/plan: Patient with history of endometriosis and affecting her Pfannenstiel scar with previous resection by general surgeon 2 is doing well pains only during menses. Compliance issues for the oral contraceptive pill. We discussed the Mirena IUD not only for contraception but also to help with her endometriosis pain and literature information was provided in Spanish and we'll schedule with her next cycle.

## 2014-12-02 ENCOUNTER — Telehealth: Payer: Self-pay | Admitting: Gynecology

## 2014-12-02 NOTE — Telephone Encounter (Signed)
12/02/14-Pt is private pay and wants Mirena IUD. Per Sherrilyn RistKari, we can give her the 55% disc on the $1495.00 cost which makes her cost $672.50 for the Mirena and 55% disc on the insertion fee makes her cost $99.00. Pt total cost would be $771.50. Debarah CrapeClaudia will call the patient with this information. Also, if this is a hardship, Sherrilyn RistKari also said that the manufacturer has an assistance program pt can apply for which is done the first of each month to see if they would be eligible.wl

## 2014-12-11 ENCOUNTER — Other Ambulatory Visit: Payer: Self-pay

## 2014-12-11 ENCOUNTER — Ambulatory Visit: Payer: Self-pay | Admitting: Gynecology

## 2015-12-17 ENCOUNTER — Ambulatory Visit (HOSPITAL_COMMUNITY)
Admission: EM | Admit: 2015-12-17 | Discharge: 2015-12-17 | Disposition: A | Discharge status: Home / Self Care | Payer: No Typology Code available for payment source | Attending: Family Medicine | Admitting: Family Medicine

## 2015-12-17 DIAGNOSIS — R102 Pelvic and perineal pain: Secondary | ICD-10-CM

## 2015-12-17 DIAGNOSIS — N809 Endometriosis, unspecified: Secondary | ICD-10-CM

## 2015-12-17 LAB — POCT URINALYSIS DIP (DEVICE)
Bilirubin Urine: NEGATIVE
GLUCOSE, UA: NEGATIVE mg/dL
Hgb urine dipstick: NEGATIVE
KETONES UR: NEGATIVE mg/dL
Nitrite: NEGATIVE
Protein, ur: NEGATIVE mg/dL
Specific Gravity, Urine: 1.02 (ref 1.005–1.030)
Urobilinogen, UA: 0.2 mg/dL (ref 0.0–1.0)
pH: 7.5 (ref 5.0–8.0)

## 2015-12-17 LAB — POCT PREGNANCY, URINE: PREG TEST UR: NEGATIVE

## 2015-12-17 MED ORDER — NAPROXEN 500 MG PO TABS: 500.0000 mg | ORAL_TABLET | Freq: Two times a day (BID) | ORAL | Status: DC

## 2015-12-17 NOTE — Discharge Instructions (Signed)
Fue un Research officer, trade unionplacer verle hoy.  Para el dolor abdominal, estoy dandole el contacto para el grupo de g[facility]cologia de [facility].  Por favor llame para marcar una cita para evaluacion con ellos.   Por el momento, NAPROXEN 500mg , tome una tableta por boca cada 12 horas con algo de comer, segun necesite para el dolor.

## 2015-12-17 NOTE — ED Provider Notes (Addendum)
CSN: [id number]     Arrival date & time 12/17/15  1345 History   First MD Initiated Contact with Patient 12/17/15 1533     No chief complaint on file.  (Consider location/radiation/quality/duration/timing/severity/associated sxs/prior Treatment) The history is provided by the patient. No language interpreter was used.   Visit conducted in Spanish.  Patient reports pelvic pain in the site of prior Pfannensteil C/S incision, with surgical intervention on the scar in 2012 and 2013 (according to notes) that revealed presence of endometriosis in scar.  Patient says previously the pain was only with menses, however in the past 2 months has been constant in midline and RLQ.  No fevers, no change appetite, no N/V/D.  No cough or chest pain, no urinary sxs including dysuria or hematuria.  She had LMP on April 16th, lasted for 5 days as usual.  Taking Flanax ([place] brand naproxen) which helps the pain.     Surgical Hx; S/p C-section x 2.  No other intra-abdominal surgeries.  Past Medical History  Diagnosis Date  . No pertinent past medical history   . PONV (postoperative nausea and vomiting)   . Endometriosis in scar of skin    Past Surgical History  Procedure Laterality Date  . Laparotomy  11/15/2011    Procedure: EXPLORATORY LAPAROTOMY;  Surgeon: Marlane HatcherWilliam S Bradford, MD;  Location: [facility];  Service: General;  Laterality: N/A;  Exploration and Excision of Endometrial Implant  . Mass excision N/A 04/09/2013    Procedure: EXCISION OF ENDOMETRIOMA;  Surgeon: Marlane HatcherWilliam S Bradford, MD;  Location: [facility];  Service: General;  Laterality: N/A;  . Cesarean section      X2   Family History  Problem Relation Age of Onset  . Diabetes Mother   . Cancer Mother     UTERINE   . Cancer Brother     LEUKEMIA  . Anesthesia problems Neg Hx   . Hypotension Neg Hx   . Malignant hyperthermia Neg Hx   . Pseudochol deficiency Neg Hx    Social History  Substance Use Topics  . Smoking status: Never Smoker   .  Smokeless tobacco: Not on file  . Alcohol Use: No   OB History    Gravida Para Term Preterm AB TAB SAB Ectopic Multiple Living   2 2 1 1  0 0 0 0 0 2     Review of Systems  Constitutional: Negative for fever, chills, diaphoresis, appetite change and fatigue.  HENT: Negative for postnasal drip.   Respiratory: Negative for cough and shortness of breath.   Cardiovascular: Negative for chest pain.  Endocrine: Negative for polyuria.  Genitourinary: Positive for pelvic pain. Negative for dysuria, urgency, frequency, hematuria, flank pain, decreased urine volume, vaginal bleeding, vaginal discharge, difficulty urinating, genital sores, vaginal pain and menstrual problem.    Allergies  Penicillins and Tylenol  Home Medications   Prior to Admission medications   Medication Sig Start Date End Date Taking? Authorizing Provider  ibuprofen (ADVIL,MOTRIN) 200 MG tablet Take 200 mg by mouth every 6 (six) hours as needed for headache or moderate pain.    Historical Provider, MD  oxyCODONE (ROXICODONE) 5 MG immediate release tablet Take 1 tablet (5 mg total) by mouth every 6 (six) hours as needed for severe pain. 10/30/14   Felicie Mornavid Smith, NP   Meds Ordered and Administered this Visit  Medications - No data to display  BP 103/80 mmHg  Pulse 69  Temp(Src) 97.7 F (36.5 C) (Oral)  Resp 16  Ht 5'  5" (1.651 m)  Wt 120 lb (54.432 kg)  BMI 19.97 kg/m2  SpO2 100% No data found.   Physical Exam  Constitutional: She appears well-developed and well-nourished. No distress.  HENT:  Head: Normocephalic.  Mouth/Throat: Oropharynx is clear and moist. No oropharyngeal exudate.  Neck: Neck supple.  Cardiovascular: Normal rate, regular rhythm and normal heart sounds.   No murmur heard. Pulmonary/Chest: Effort normal and breath sounds normal. No respiratory distress. She has no wheezes. She has no rales. She exhibits no tenderness.  Abdominal: Soft. She exhibits no distension and no mass. There is  tenderness. There is no rebound and no guarding.  Vertical incision in midline abdomen, tenderness to palpate. Incision healed and dry, non-erythematous. Tenderenss along scar line as well as in RLQ, although not tender at McBurnies point. Audible bowel sounds. No guarding or rebound.   Lymphadenopathy:    She has no cervical adenopathy.  Skin: She is not diaphoretic.    ED Course  Procedures (including critical care time)  Labs Review Labs Reviewed - No data to display  Imaging Review No results found.   Visual Acuity Review  Right Eye Distance:   Left Eye Distance:   Bilateral Distance:    Right Eye Near:   Left Eye Near:    Bilateral Near:         MDM   1. Pelvic pain in female    Patient with history endometriosis in C-s scar in the past, now with similar pain that is more constant. Upregnancy and UA dipstick done in [facility] today, negative pregnancy and not indicative of UTI. Exam not c/w appy or other acute abdomen.  Referred to [facility] service at [facility] for further evaluation/management. Offered Toradol IM, patient prefers to continue with oral NSAID for the pain.  Discussed reasons for prompt return to [facility] or ED (fevers, N/V, extreme worsening of pain).     Barbaraann Barthel, MD 12/17/15 1557  Barbaraann Barthel, MD 12/17/15 [phone]

## 2015-12-17 NOTE — ED Notes (Signed)
Pt was triaged and assessed by Dr. Mauricio PoBreen.  Pt is spanish speaking only.

## 2016-01-05 ENCOUNTER — Inpatient Hospital Stay (HOSPITAL_COMMUNITY)
Admission: AD | Admit: 2016-01-05 | Discharge: 2016-01-05 | Disposition: A | Discharge status: Home / Self Care | Payer: Self-pay | Source: Ambulatory Visit | Attending: Family Medicine | Admitting: Family Medicine

## 2016-01-05 ENCOUNTER — Telehealth: Payer: Self-pay | Admitting: Obstetrics and Gynecology

## 2016-01-05 ENCOUNTER — Encounter (HOSPITAL_COMMUNITY): Payer: Self-pay | Admitting: *Deleted

## 2016-01-05 DIAGNOSIS — Z833 Family history of diabetes mellitus: Secondary | ICD-10-CM | POA: Insufficient documentation

## 2016-01-05 DIAGNOSIS — Z888 Allergy status to other drugs, medicaments and biological substances status: Secondary | ICD-10-CM | POA: Insufficient documentation

## 2016-01-05 DIAGNOSIS — R102 Pelvic and perineal pain: Secondary | ICD-10-CM | POA: Insufficient documentation

## 2016-01-05 DIAGNOSIS — Z880 Allergy status to penicillin: Secondary | ICD-10-CM | POA: Insufficient documentation

## 2016-01-05 DIAGNOSIS — N806 Endometriosis in cutaneous scar: Secondary | ICD-10-CM | POA: Insufficient documentation

## 2016-01-05 LAB — URINALYSIS, ROUTINE W REFLEX MICROSCOPIC
BILIRUBIN URINE: NEGATIVE
GLUCOSE, UA: NEGATIVE mg/dL
KETONES UR: NEGATIVE mg/dL
Leukocytes, UA: NEGATIVE
Nitrite: NEGATIVE
PROTEIN: NEGATIVE mg/dL
Specific Gravity, Urine: 1.02 (ref 1.005–1.030)
pH: 5.5 (ref 5.0–8.0)

## 2016-01-05 LAB — POCT PREGNANCY, URINE: PREG TEST UR: NEGATIVE

## 2016-01-05 LAB — URINE MICROSCOPIC-ADD ON: WBC, UA: NONE SEEN WBC/hpf (ref 0–5)

## 2016-01-05 NOTE — MAU Note (Signed)
Patient presents stating she is not pregnant and she was informed by her OB that she has endometriosis X 3 years since her last C/S; c/o abdominal pain; denies bleeding or discharge.

## 2016-01-05 NOTE — Discharge Instructions (Signed)
Endometriosis  (Endometriosis)  La endometriosis es una enfermedad en la que el tejido que rodea al útero (endometrio) crece fuera de su ubicación normal. El tejido puede crecer en muchos lugares cerca del útero, pero comúnmente crece en los ovarios, las trompas de Falopio, la vagina o el intestino. Dado que el útero expulsa o desprende su revestimiento en cada ciclo menstrual, hay sangrado en el lugar donde se localiza el tejido endometrial. Esto puede causar dolor porque la sangre es irritante para los tejidos que no están normalmente expuestos a esta.   CAUSAS   Se desconoce la causa de la endometriosis.   SIGNOS Y SÍNTOMAS   A menudo, no hay síntomas. Cuando se presentan síntomas, estos pueden variar según la ubicación del tejido desplazado. Pueden ocurrir diversos síntomas en diferentes momentos. Aunque los síntomas se producen principalmente durante el período menstrual de una mujer, también pueden aparecer en la mitad del ciclo, y generalmente terminan con la menopausia. Algunas personas pueden pasar meses sin experimentar ningún tipo de síntomas. Los síntomas pueden incluir:   · Dolor abdominal o en la espalda.  · Sangrado más abundante durante los períodos menstruales.  · Dolor durante las relaciones sexuales.  · Dolor al defecar.  · Infertilidad.  DIAGNÓSTICO   El médico le preguntará acerca de sus síntomas y le hará un examen físico. Se pueden realizar varios estudios, por ejemplo:   · Análisis de sangre y orina. Estos se realizan para ayudar a descartar otros problemas.  · Ecografías. Este estudio se realiza para observar el tejido anormal.  · Radiografía del recto (enema de bario).  · Laparoscopia. En este procedimiento, se introduce en el abdomen un tubo delgado, que emite luz y tiene una pequeña cámara en el extremo (laparoscopio). Esto ayuda a que su médico observe el tejido anormal para confirmar el diagnóstico. El médico también puede tomar una pequeña muestra de tejido anormal (biopsia) que  encuentra. Posteriormente esta muestra de tejido se envía a un laboratorio para examinarlo con un microscopio.  TRATAMIENTO   El tratamiento variará y puede incluir lo siguiente:   · Medicamentos para aliviar el dolor. Los antiinflamatorios no esteroides (AINE) son un tipo de analgésico que pueden ayudar a aliviar el dolor causado por la endometriosis.  · Terapia hormonal. Cuando se use la terapia hormonal, se eliminan los períodos menstruales. Esto elimina la exposición mensual a la sangre por el tejido endometrial desplazado.  · Cirugía. Algunas veces puede hacerse una cirugía para extirpar el tejido endometrial anormal. Cuando los casos son graves, puede realizarse una cirugía para extirpar las trompas de Falopio, el útero y los ovarios (histerectomía).  INSTRUCCIONES PARA EL CUIDADO EN EL HOGAR   · Tome todos los medicamentos como se lo haya indicado el médico. No tome aspirina porque este medicamento puede aumentar el sangrado cuando no recibe terapia hormonal.  · Evite actividades que produzcan dolor, incluida la actividad sexual.  SOLICITE ATENCIÓN MÉDICA SI:  · Tiene dolor pélvico durante los períodos menstruales y antes y después de estos.  · Siente dolor pélvico entre los períodos menstruales que empeora durante el período.  · Experimenta dolor pélvico durante la actividad sexual o después de esta.  · Siente dolor pélvico al defecar u orinar, especialmente durante el período menstrual.  · Tiene dificultad para quedar embarazada.  · Tiene fiebre.  SOLICITE ATENCIÓN MÉDICA DE INMEDIATO SI:   · El dolor es intenso y no responde a los analgésicos.  · Siente náuseas y vómitos intensos, o no puede retener   los alimentos.  · Tiene dolor que se limita a la parte inferior derecha del abdomen.  · Presenta hinchazón o aumento del dolor en el abdomen.  · Observa sangre en la materia fecal.  ASEGÚRESE DE QUE:   · Comprende estas instrucciones.  · Controlará su afección.  · Recibirá ayuda de inmediato si no mejora o si  empeora.     Esta información no tiene como fin reemplazar el consejo del médico. Asegúrese de hacerle al médico cualquier pregunta que tenga.     Document Released: 08/02/2005 Document Revised: 08/23/2014  Elsevier Interactive Patient Education ©2016 [employer]

## 2016-01-05 NOTE — MAU Provider Note (Signed)
History     CSN: [id number]  Arrival date and time: 01/05/16 1648   First Provider Initiated Contact with Patient 01/05/16 1719      Chief Complaint  Patient presents with  . Endometriosis   HPI   [name] is a 35 y.o. female G2P1102 here in MAU with complaints of lower quadrant abdominal pain. The pain is chronic in nature X 3 years.  The pain is worse centrally but exists on the left and right lower quadrants as well. She reports being diagnosed with endometriosis 3-4 years ago. In 2013 and 2014, she had surgeries to remove endometrial tissue. The pain returned one year ago and has been worsening. The pain is worse during and after her periods and when exercising. She denies pain during urination, defecation, or intercourse. She is taking Flanax for pain. Her menstrual cycles are sometimes normal and sometimes heavier than usual. There is no bleeding between periods. She denies nausea, vomiting, constipation, or diarrhea.   She hesitated to have another surgery, however now desires to have hysterectomy. [facility] to [facility] this morning to schedule an appointment and was able to schedule one for this Thursday. She informed the staff that she was in a lot of pain and they instructed her to be seen here in MAU.   She is taking her Flanax BID; last dose was this afternoon. She feels the pain medicine works "some".   Patient was pregnant in 2008 and 2012 and delivered both by C-section. She has no other medical conditions and does not take any medicines regularly except for Flanax. She denies alcohol, tobacco, or illicit drug use. She also denies headache, dizziness, chest pain, or shortness of breath.   Spanish interpretor used.   OB History    Gravida Para Term Preterm AB TAB SAB Ectopic Multiple Living   0 0 0 0 0 2      Past Medical History  Diagnosis Date  . No pertinent past medical history   . PONV (postoperative nausea and vomiting)   . Endometriosis in  scar of skin     Past Surgical History  Procedure Laterality Date  . Laparotomy  11/15/2011    Procedure: EXPLORATORY LAPAROTOMY;  Surgeon: Marlane Hatcher, MD;  Location: [facility];  Service: General;  Laterality: N/A;  Exploration and Excision of Endometrial Implant  . Mass excision N/A 04/09/2013    Procedure: EXCISION OF ENDOMETRIOMA;  Surgeon: Marlane Hatcher, MD;  Location: [facility];  Service: General;  Laterality: N/A;  . Cesarean section      X2    Family History  Problem Relation Age of Onset  . Diabetes Mother   . Cancer Mother     UTERINE   . Cancer Brother     LEUKEMIA  . Anesthesia problems Neg Hx   . Hypotension Neg Hx   . Malignant hyperthermia Neg Hx   . Pseudochol deficiency Neg Hx     Social History  Substance Use Topics  . Smoking status: Never Smoker   . Smokeless tobacco: None  . Alcohol Use: No    Allergies:  Allergies  Allergen Reactions  . Penicillins Other (See Comments)    Unknown reaction  . Tylenol [Acetaminophen] Anxiety    Causes itching    Prescriptions prior to admission  Medication Sig Dispense Refill Last Dose  . ibuprofen (ADVIL,MOTRIN) 200 MG tablet Take 200 mg by mouth every 6 (six) hours as needed for headache or moderate pain.  10/29/2014 at Unknown time  . naproxen (NAPROSYN) 500 MG tablet Take 1 tablet (500 mg total) by mouth 2 (two) times daily with a meal. 30 tablet 0   . oxyCODONE (ROXICODONE) 5 MG immediate release tablet Take 1 tablet (5 mg total) by mouth every 6 (six) hours as needed for severe pain. 10 tablet 0    Results for orders placed or performed during the hospital encounter of 01/05/16 (from the past 48 hour(s))  Urinalysis, Routine w reflex microscopic ([facility])     Status: Abnormal   Collection Time: 01/05/16  5:02 PM  Result Value Ref Range   Color, Urine YELLOW YELLOW   APPearance CLEAR CLEAR   Specific Gravity, Urine 1.020 1.005 - 1.030   pH 5.5 5.0 - 8.0   Glucose, UA NEGATIVE NEGATIVE mg/dL    Hgb urine dipstick TRACE (A) NEGATIVE   Bilirubin Urine NEGATIVE NEGATIVE   Ketones, ur NEGATIVE NEGATIVE mg/dL   Protein, ur NEGATIVE NEGATIVE mg/dL   Nitrite NEGATIVE NEGATIVE   Leukocytes, UA NEGATIVE NEGATIVE  Urine microscopic-add on     Status: Abnormal   Collection Time: 01/05/16  5:02 PM  Result Value Ref Range   Squamous Epithelial / LPF 0-5 (A) NONE SEEN   WBC, UA NONE SEEN 0 - 5 WBC/hpf   RBC / HPF 0-5 0 - 5 RBC/hpf   Bacteria, UA RARE (A) NONE SEEN  Pregnancy, urine POC     Status: None   Collection Time: 01/05/16  5:28 PM  Result Value Ref Range   Preg Test, Ur NEGATIVE NEGATIVE    Comment:        THE SENSITIVITY OF THIS METHODOLOGY IS >24 mIU/mL    Review of Systems  Constitutional: Negative for fever and chills.  Gastrointestinal: Positive for abdominal pain. Negative for nausea, vomiting, diarrhea and constipation.   Physical Exam   Blood pressure 114/71, pulse 66, temperature 97.6 F (36.4 C), temperature source Oral, resp. rate 20, height 5\' 5"  (1.651 m), weight 130 lb 12 oz (59.308 kg), last menstrual period 12/30/2015.  Physical Exam  Constitutional: She is oriented to person, place, and time. She appears well-developed and well-nourished. No distress.  HENT:  Head: Normocephalic.  GI: Soft. Normal appearance. There is no hepatosplenomegaly. There is generalized tenderness. There is no rigidity, no rebound and no guarding.  Neurological: She is alert and oriented to person, place, and time.  Skin: Skin is warm. She is not diaphoretic.    MAU Course  Procedures  None  MDM I offered the patient a dose of ultram here in which the patient denied. I was happy to give her an RX for pain medication at home, however the patient declined. The is concerned about taking narcotic pain medication because she has in the past and it has caused severed vomiting which in turns causes worsening of abdominal pain. The patient requests to leave MAU and follow up the [facility]  on Thursday.    Assessment and Plan   A:  1. Endometriosis in scar of skin   2. Pelvic pain in female     P:  Discharge home in stable condition  Follow up with the [facility] on Thursday as scheduled Continue taking Flanax. If symptoms worsen return to MAU   Duane LopeJennifer I Linus Weckerly, NP  01/06/2016 1:09 PM

## 2016-01-08 ENCOUNTER — Encounter: Payer: Self-pay | Admitting: Obstetrics & Gynecology

## 2016-01-08 ENCOUNTER — Ambulatory Visit (INDEPENDENT_AMBULATORY_CARE_PROVIDER_SITE_OTHER): Payer: Self-pay | Admitting: Obstetrics & Gynecology

## 2016-01-08 VITALS — BP 112/64 | HR 68 | Wt 130.9 lb

## 2016-01-08 DIAGNOSIS — N806 Endometriosis in cutaneous scar: Secondary | ICD-10-CM

## 2016-01-08 MED ORDER — NORGESTIMATE-ETH ESTRADIOL 0.25-35 MG-MCG PO TABS
1.0000 | ORAL_TABLET | Freq: Every day | ORAL | Status: DC
Start: 2016-01-08 — End: 2017-01-17

## 2016-01-08 MED ORDER — NAPROXEN 500 MG PO TABS: 500.0000 mg | ORAL_TABLET | Freq: Two times a day (BID) | ORAL | Status: DC

## 2016-01-08 NOTE — Progress Notes (Signed)
CLINIC ENCOUNTER NOTE  History:  35 y.o. 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 Patient is Spanish-speaking only, Spanish interpreter present for this encounter.  She reports a lot of pain in this area, especially during her menses. She had two previous excisions of this endometrioma by General Surgery, last one was in 2014. She had been on Depo Provera in the past; but doesnot like that her symptoms recur after discontinuation of hormones.  She desires a hysterectomy and removal of ovaries for definitive management.  Denies any pelvic pain, problems with intercourse.   She is accompanied by her husband.    Past Medical History  Diagnosis Date  . PONV (postoperative nausea and vomiting)   . Endometriosis in scar of skin     Past Surgical History  Procedure Laterality Date  . Laparotomy  11/15/2011    Procedure: EXPLORATORY LAPAROTOMY;  Surgeon: Marlane Hatcher, MD;  Location: [facility];  Service: General;  Laterality: N/A;  Exploration and Excision of Endometrial Implant  . Mass excision N/A 04/09/2013    Procedure: EXCISION OF ENDOMETRIOMA;  Surgeon: Marlane Hatcher, MD;  Location: [facility];  Service: General;  Laterality: N/A;  . Cesarean section      X2    The following portions of the patient's history were reviewed and updated as appropriate: allergies, current medications, past family history, past medical history, past social history, past surgical history and problem list.   Health Maintenance:  Normal pap and negative HRHPV on 10/25/2013.    Review of Systems:  Pertinent items noted in HPI and remainder of comprehensive ROS otherwise negative.  Objective:  Physical Exam BP 112/64 mmHg  Pulse 68  Wt 130 lb 14.4 oz (59.376 kg)  LMP 12/30/2015 CONSTITUTIONAL: Well-developed, well-nourished female in no acute distress.  HENT:  Normocephalic, atraumatic. External right and left ear normal. Oropharynx is clear and moist EYES:  Conjunctivae and EOM are normal. Pupils are equal, round, and reactive to light. No scleral icterus.  NECK: Normal range of motion, supple, no masses SKIN: Skin is warm and dry. No rash noted. Not diaphoretic. No erythema. No pallor. NEUROLOGIC: Alert and oriented to person, place, and time. Normal reflexes, muscle tone coordination. No cranial nerve deficit noted. PSYCHIATRIC: Normal mood and affect. Normal behavior. Normal judgment and thought content. CARDIOVASCULAR: Normal heart rate noted RESPIRATORY: Effort and breath sounds normal, no problems with respiration noted ABDOMEN: Soft, no distention noted.    3-4 cm soft tissue mass palpated on R side above cesarean incision; has moderate tenderness to palpation PELVIC: Deferred MUSCULOSKELETAL: Normal range of motion. No edema noted.   Assessment & Plan:  Endometriosis in scar of skin Had a long detailed discussion with patient explaining that hysterectomy and BSO will not help with the foci of incisional endometriosis.  This may help with hormonal suppression of the endometrioma, but the risks of early menopause outweigh surgical hormonal suppression.  The endometrioma is suprafascial, not intraperitoneal and hysterectomy is not the management option for her.  She needs hormonal suppression and possible re-excision by General Surgery. Advised to take Sprintec in a continuous fashion to help with pain and dysmenorrhea. CT scan ordered for further characterization; will also refer to General Surgery. - Basic metabolic panel - Ambulatory referral to General Surgery - norgestimate-ethinyl estradiol (ORTHO-CYCLEN,SPRINTEC,PREVIFEM) 0.25-35 MG-MCG tablet; Take 1 tablet by mouth daily. Take only active pills in a continuous fashion  Dispense: 1 Package; Refill: 11 - naproxen (NAPROSYN) 500 MG tablet;  Take 1 tablet (500 mg total) by mouth 2 (two) times daily with a meal. As needed for pain  Dispense: 60 tablet; Refill: 3 - CT Abdomen Pelvis W  Contrast; Future  Total face-to-face time with patient: 30 minutes. Over 50% of encounter was spent on counseling and coordination of care.   Jaynie CollinsUGONNA  Zyionna Pesce, MD, FACOG Attending Obstetrician & Gynecologist, [facility]

## 2016-01-08 NOTE — Patient Instructions (Signed)
Regrese a la clinica cuando tenga su cita. Si tiene problemas o preguntas, llama a la clinica o vaya a la sala de emergencia al [facility].    

## 2016-01-09 LAB — BASIC METABOLIC PANEL
BUN: 17 mg/dL (ref 7–25)
CHLORIDE: 102 mmol/L (ref 98–110)
CO2: 26 mmol/L (ref 20–31)
Calcium: 8.7 mg/dL (ref 8.6–10.2)
Creat: 0.55 mg/dL (ref 0.50–1.10)
Glucose, Bld: 93 mg/dL (ref 65–99)
POTASSIUM: 4 mmol/L (ref 3.5–5.3)
SODIUM: 137 mmol/L (ref 135–146)

## 2016-01-14 ENCOUNTER — Encounter (HOSPITAL_COMMUNITY): Payer: Self-pay

## 2016-01-14 ENCOUNTER — Ambulatory Visit (HOSPITAL_COMMUNITY)
Admission: RE | Admit: 2016-01-14 | Discharge: 2016-01-14 | Disposition: A | Discharge status: Home / Self Care | Payer: Self-pay | Source: Ambulatory Visit | Attending: Obstetrics & Gynecology | Admitting: Obstetrics & Gynecology

## 2016-01-14 DIAGNOSIS — N806 Endometriosis in cutaneous scar: Secondary | ICD-10-CM | POA: Insufficient documentation

## 2016-01-14 MED ORDER — IOPAMIDOL (ISOVUE-300) INJECTION 61%
100.0000 mL | Freq: Once | INTRAVENOUS | Status: AC | PRN
  Administered 2016-01-14: 100 mL via INTRAVENOUS

## 2016-01-15 ENCOUNTER — Telehealth: Payer: Self-pay | Admitting: General Practice

## 2016-01-15 NOTE — Telephone Encounter (Signed)
Per Dr Macon LargeAnyanwu, patient needs referral to General Surgery. [facility] will not see pt. [facility] & [facility] will review patient's records to see if they are agreeable to see her. Patient would have to pay $250 up front. Their phone number is [phone]. Called patient with [facility] and her husband answered. Told him that we were still working on her referral and will contact them when we know more about the appt. He verbalized understanding & had no questions

## 2016-01-26 ENCOUNTER — Encounter: Payer: Self-pay | Admitting: Gynecology

## 2016-01-29 NOTE — Telephone Encounter (Signed)
[facility] on July [facility]th @ 10am at [address], [place] [address].

## 2016-02-04 NOTE — Telephone Encounter (Signed)
Called patient with pacific interpreter [phone] at both numbers. No answer on either. Unable to leave message

## 2016-02-05 NOTE — Telephone Encounter (Signed)
Called patient with pacific interpreter [phone] and informed patient of appt and location. Patient verbalized understanding & had no questions

## 2016-02-25 ENCOUNTER — Encounter: Payer: Self-pay | Admitting: *Deleted

## 2016-03-15 ENCOUNTER — Telehealth: Payer: Self-pay | Admitting: General Practice

## 2016-03-15 NOTE — Telephone Encounter (Signed)
Patient called into front office and left message stating she wants to know if she needs a follow up appt to be seen. Per Dr Jolayne Panther, patient does not need follow up appt since she was referred to a gen surgeon. Patient does need to continue OCPs continuously though. Called patient with [facility] for interpreter & discussed info with patient. Patient verbalized understanding & states she finished the pills. Told patient she has refills so she needs to pick up the next supply and continuing taking them every day. Patient verbalized understanding & had no questions

## 2016-03-16 HISTORY — PX: OVARIAN CYST REMOVAL: SHX89

## 2016-03-26 ENCOUNTER — Encounter (HOSPITAL_COMMUNITY): Payer: Self-pay

## 2016-03-26 ENCOUNTER — Inpatient Hospital Stay (HOSPITAL_COMMUNITY): Payer: Self-pay

## 2016-03-26 ENCOUNTER — Inpatient Hospital Stay (HOSPITAL_COMMUNITY)
Admission: AD | Admit: 2016-03-26 | Discharge: 2016-03-26 | Disposition: A | Discharge status: Home / Self Care | Payer: Self-pay | Source: Ambulatory Visit | Attending: Obstetrics & Gynecology | Admitting: Obstetrics & Gynecology

## 2016-03-26 DIAGNOSIS — K5900 Constipation, unspecified: Secondary | ICD-10-CM | POA: Insufficient documentation

## 2016-03-26 DIAGNOSIS — Z9889 Other specified postprocedural states: Secondary | ICD-10-CM | POA: Insufficient documentation

## 2016-03-26 DIAGNOSIS — R112 Nausea with vomiting, unspecified: Secondary | ICD-10-CM | POA: Insufficient documentation

## 2016-03-26 DIAGNOSIS — T8189XA Other complications of procedures, not elsewhere classified, initial encounter: Secondary | ICD-10-CM | POA: Insufficient documentation

## 2016-03-26 DIAGNOSIS — X58XXXA Exposure to other specified factors, initial encounter: Secondary | ICD-10-CM | POA: Insufficient documentation

## 2016-03-26 LAB — COMPREHENSIVE METABOLIC PANEL
ALBUMIN: 4.6 g/dL (ref 3.5–5.0)
ALK PHOS: 57 U/L (ref 38–126)
ALT: 27 U/L (ref 14–54)
ANION GAP: 7 (ref 5–15)
AST: 25 U/L (ref 15–41)
BUN: 21 mg/dL — ABNORMAL HIGH (ref 6–20)
CALCIUM: 9.2 mg/dL (ref 8.9–10.3)
CHLORIDE: 106 mmol/L (ref 101–111)
CO2: 24 mmol/L (ref 22–32)
Creatinine, Ser: 0.51 mg/dL (ref 0.44–1.00)
GFR calc non Af Amer: >60 mL/min (ref >60)
GLUCOSE: 116 mg/dL — AB (ref 65–99)
Potassium: 4.1 mmol/L (ref 3.5–5.1)
SODIUM: 137 mmol/L (ref 135–145)
Total Bilirubin: 1 mg/dL (ref 0.3–1.2)
Total Protein: 7.6 g/dL (ref 6.5–8.1)

## 2016-03-26 LAB — URINALYSIS, ROUTINE W REFLEX MICROSCOPIC
Glucose, UA: NEGATIVE mg/dL
Leukocytes, UA: NEGATIVE
Nitrite: NEGATIVE
PROTEIN: 30 mg/dL — AB
Specific Gravity, Urine: 1.015 (ref 1.005–1.030)
pH: 8 (ref 5.0–8.0)

## 2016-03-26 LAB — CBC
HCT: 37.9 % (ref 36.0–46.0)
HEMOGLOBIN: 13.8 g/dL (ref 12.0–15.0)
MCH: 30.7 pg (ref 26.0–34.0)
MCHC: 36.4 g/dL — AB (ref 30.0–36.0)
MCV: 84.2 fL (ref 78.0–100.0)
PLATELETS: 197 10*3/uL (ref 150–400)
RBC: 4.5 MIL/uL (ref 3.87–5.11)
RDW: 12 % (ref 11.5–15.5)
WBC: 11.2 10*3/uL — ABNORMAL HIGH (ref 4.0–10.5)

## 2016-03-26 LAB — URINE MICROSCOPIC-ADD ON

## 2016-03-26 LAB — AMYLASE: AMYLASE: 33 U/L (ref 28–100)

## 2016-03-26 LAB — LIPASE, BLOOD: LIPASE: 23 U/L (ref 11–51)

## 2016-03-26 LAB — POCT PREGNANCY, URINE: PREG TEST UR: NEGATIVE

## 2016-03-26 MED ORDER — METOCLOPRAMIDE HCL 10 MG PO TABS
10.0000 mg | ORAL_TABLET | Freq: Four times a day (QID) | ORAL | Status: DC | PRN
  Administered 2016-03-26: 10 mg via ORAL
  Filled 2016-03-26: qty 1

## 2016-03-26 MED ORDER — LACTATED RINGERS IV BOLUS (SEPSIS)
1000.0000 mL | Freq: Once | INTRAVENOUS | Status: AC
  Administered 2016-03-26: 1000 mL via INTRAVENOUS

## 2016-03-26 MED ORDER — DEXAMETHASONE SODIUM PHOSPHATE 10 MG/ML IJ SOLN: 10.0000 mg | Freq: Once | INTRAMUSCULAR | Status: DC

## 2016-03-26 NOTE — MAU Provider Note (Signed)
History     CSN: [id number]  Arrival date and time: 03/26/16 0438   None     Chief Complaint  Patient presents with  . Emesis   Non-pregnant female c/o acute onset of N/V, dizziness, and upper abdominal pain about 5 hrs ago. She reports 6 episodes of vomiting since. She reports intermittent colicky upper abdominal pain since that time. She denies fever. She reports constipation with no BM in 4 days. 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 She reports feeling well and minimal incisional pain not requiring meds prior to tonight.      Pertinent Gynecological History: Menses: 03/18/16 Contraception: OCP (estrogen/progesterone)   Past Medical History:  Diagnosis Date  . Endometriosis in scar of skin   . PONV (postoperative nausea and vomiting)     Past Surgical History:  Procedure Laterality Date  . CESAREAN SECTION     X2  . LAPAROTOMY  11/15/2011   Procedure: EXPLORATORY LAPAROTOMY;  Surgeon: Marlane Hatcher, MD;  Location: [facility];  Service: General;  Laterality: N/A;  Exploration and Excision of Endometrial Implant  . MASS EXCISION N/A 04/09/2013   Procedure: EXCISION OF ENDOMETRIOMA;  Surgeon: Marlane Hatcher, MD;  Location: [facility];  Service: General;  Laterality: N/A;    Family History  Problem Relation Age of Onset  . Diabetes Mother   . Cancer Mother     UTERINE   . Cancer Brother     LEUKEMIA  . Anesthesia problems Neg Hx   . Hypotension Neg Hx   . Malignant hyperthermia Neg Hx   . Pseudochol deficiency Neg Hx     Social History  Substance Use Topics  . Smoking status: Never Smoker  . Smokeless tobacco: Never Used  . Alcohol use No    Allergies:  Allergies  Allergen Reactions  . Penicillins Itching and Other (See Comments)    Causes dizziness and burning inside.  Has patient had a PCN reaction causing immediate rash, facial/tongue/throat swelling, SOB or lightheadedness with  hypotension: No Has patient had a PCN reaction causing severe rash involving mucus membranes or skin necrosis: No Has patient had a PCN reaction that required hospitalization No Has patient had a PCN reaction occurring within the last 10 years: Yes If all of the above answers are "NO", then may proceed with Cephalosporin use.   . Tylenol [Acetaminophen] Anxiety    Causes itching    Prescriptions Prior to Admission  Medication Sig Dispense Refill Last Dose  . naproxen (NAPROSYN) 500 MG tablet Take 1 tablet (500 mg total) by mouth 2 (two) times daily with a meal. As needed for pain 60 tablet 3   . norgestimate-ethinyl estradiol (ORTHO-CYCLEN,SPRINTEC,PREVIFEM) 0.25-35 MG-MCG tablet Take 1 tablet by mouth daily. Take only active pills in a continuous fashion 1 Package 11     Review of Systems  Constitutional: Negative for chills and fever.  Gastrointestinal: Positive for abdominal pain and constipation.  Neurological: Positive for dizziness.   Physical Exam   Blood pressure 105/75, pulse 78, temperature 97.5 F (36.4 C), temperature source Oral, resp. rate 20, height 5\' 4"  (1.626 m), weight 57.9 kg (127 lb 12 oz), last menstrual period 03/18/2016.  Physical Exam  Constitutional: She is oriented to person, place, and time. She appears well-developed and well-nourished.  HENT:  Head: Normocephalic and atraumatic.  Neck: Normal range of motion. Neck supple.  Cardiovascular: Normal rate and regular rhythm.   Respiratory:  Effort normal and breath sounds normal.  GI: Soft. She exhibits no distension and no mass. There is tenderness (umbilicus and diffuse throughout all quadrants). There is no rebound and no guarding.  Musculoskeletal: Normal range of motion.  Neurological: She is alert and oriented to person, place, and time.  Skin: Skin is warm and dry.  Low transverse abd incision-c/d/i, well approximated, no erythema or edema   Results for orders placed or performed during the  hospital encounter of 03/26/16 (from the past 24 hour(s))  Urinalysis, Routine w reflex microscopic (not [facility])     Status: Abnormal   Collection Time: 03/26/16  4:50 AM  Result Value Ref Range   Color, Urine YELLOW YELLOW   APPearance CLEAR CLEAR   Specific Gravity, Urine 1.015 1.005 - 1.030   pH 8.0 5.0 - 8.0   Glucose, UA NEGATIVE NEGATIVE mg/dL   Hgb urine dipstick TRACE (A) NEGATIVE   Bilirubin Urine SMALL (A) NEGATIVE   Ketones, ur >80 (A) NEGATIVE mg/dL   Protein, ur 30 (A) NEGATIVE mg/dL   Nitrite NEGATIVE NEGATIVE   Leukocytes, UA NEGATIVE NEGATIVE  Urine microscopic-add on     Status: Abnormal   Collection Time: 03/26/16  4:50 AM  Result Value Ref Range   Squamous Epithelial / LPF 0-5 (A) NONE SEEN   WBC, UA 0-5 0 - 5 WBC/hpf   RBC / HPF 0-5 0 - 5 RBC/hpf   Bacteria, UA RARE (A) NONE SEEN  Pregnancy, urine POC     Status: None   Collection Time: 03/26/16  5:20 AM  Result Value Ref Range   Preg Test, Ur NEGATIVE NEGATIVE  CBC     Status: Abnormal   Collection Time: 03/26/16  5:41 AM  Result Value Ref Range   WBC 11.2 (H) 4.0 - 10.5 K/uL   RBC 4.50 3.87 - 5.11 MIL/uL   Hemoglobin 13.8 12.0 - 15.0 g/dL   HCT 16.137.9 09.636.0 - 04.546.0 %   MCV 84.2 78.0 - 100.0 fL   MCH 30.7 26.0 - 34.0 pg   MCHC 36.4 (H) 30.0 - 36.0 g/dL   RDW 40.912.0 81.111.5 - 91.415.5 %   Platelets 197 150 - 400 K/uL  Amylase     Status: None   Collection Time: 03/26/16  5:45 AM  Result Value Ref Range   Amylase 33 28 - 100 U/L  Lipase, blood     Status: None   Collection Time: 03/26/16  5:45 AM  Result Value Ref Range   Lipase 23 11 - 51 U/L  Comprehensive metabolic panel     Status: Abnormal   Collection Time: 03/26/16  5:51 AM  Result Value Ref Range   Sodium 137 135 - 145 mmol/L   Potassium 4.1 3.5 - 5.1 mmol/L   Chloride 106 101 - 111 mmol/L   CO2 24 22 - 32 mmol/L   Glucose, Bld 116 (H) 65 - 99 mg/dL   BUN 21 (H) 6 - 20 mg/dL   Creatinine, Ser 7.820.51 0.44 - 1.00 mg/dL   Calcium 9.2 8.9 - 95.610.3 mg/dL    Total Protein 7.6 6.5 - 8.1 g/dL   Albumin 4.6 3.5 - 5.0 g/dL   AST 25 15 - 41 U/L   ALT 27 14 - 54 U/L   Alkaline Phosphatase 57 38 - 126 U/L   Total Bilirubin 1.0 0.3 - 1.2 mg/dL   GFR calc non Af Amer >60 >60 mL/min   GFR calc Af Amer >60 >60 mL/min  Anion gap 7 5 - 15   Dg Abd 1 View  Result Date: 03/26/2016 CLINICAL DATA:  Initial evaluation for postoperative nausea and vomiting, abdominal pain. EXAM: ABDOMEN - 1 VIEW COMPARISON:  Prior CT from 01/14/2016. FINDINGS: Bowel gas pattern within normal limits without evidence for obstruction or ileus. No abnormal bowel wall thickening. No free air identified on this limited supine view the abdomen. No soft tissue mass or abnormal calcification. Pelvic phlebolith overlies the left pelvic inlet. Moderate amount retained stool within the colon, suggesting constipation. Visualized osseous structures demonstrate no acute abnormality. 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 IMPRESSION: 1. Nonobstructive bowel gas pattern with no radiographic evidence for acute intra-abdominal process. 2. Moderate amount of retained stool within the colon, suggesting constipation. Electronically Signed   By: Rise Mu M.D.   On: 03/26/2016 07:02    MAU Course  Procedures Reglan 10 mg po Soap suds enema with good return and improved pain. Patient states she is feeling better and is ready to go home.    Assessment and Plan    A:  1. Constipation, unspecified constipation type   2. Post-operative nausea and vomiting      P:  Discussed constipation management at home Return to MAU if symptoms worsen Follow up with GYN as scheduled Increase PO fluid intake   Donette Larry, CNM 03/26/2016, 5:44 AM   Duane Lope, NP 03/26/2016 8:54 AM

## 2016-03-26 NOTE — MAU Note (Signed)
PT SAYS  SHE  STARTED  VOMITING  TONIGHT    HAD   SURGERY ON  C/S   SCAR  ON MON 8-7.

## 2016-03-26 NOTE — MAU Note (Signed)
18 gauge IV removed from Right wrist.  Catheter intact.  IV site clean, dry, and intact.  IV insertion not charted by night shift RN.

## 2016-03-26 NOTE — Discharge Instructions (Signed)
Estreimiento - Adultos (Constipation, Adult) Estreimiento significa que una persona tiene menos de tres evacuaciones en una semana, dificultad para defecar, o que las heces son secas, duras, o ms grandes que lo normal. A medida que envejecemos el estreimiento es ms comn. Una dieta baja en fibra, no tomar suficientes lquidos y el uso de ciertos medicamentos pueden empeorar el estreimiento.  CAUSAS   Ciertos medicamentos, como los antidepresivos, analgsicos, suplementos de hierro, anticidos y diurticos.  Algunas enfermedades, como la diabetes, el sndrome del colon irritable, enfermedad de la tiroides, o depresin.  No beber suficiente agua.  No consumir suficientes alimentos ricos en fibra.  Situaciones de estrs o viajes.  Falta de actividad fsica o de ejercicio.  Ignorar la necesidad sbita de defecar.  Uso en exceso de laxantes. SIGNOS Y SNTOMAS   Defecar menos de tres veces por semana.  Dificultad para defecar.  Tener las heces secas y duras, o ms grandes que las normales.  Sensacin de estar lleno o hinchado.  Dolor en la parte baja del abdomen.  No sentir alivio despus de defecar. DIAGNSTICO  El mdico le har una historia clnica y un examen fsico. Pueden hacerle exmenes adicionales para el estreimiento grave. Estos estudios pueden ser:  Un radiografa con enema de bario para examinar el recto, el colon y, en algunos casos, el intestino delgado.  Una sigmoidoscopia para examinar el colon inferior.  Una colonoscopia para examinar todo el colon. TRATAMIENTO  El tratamiento depender de la gravedad del estreimiento y de la causa. Algunos tratamientos nutricionales son beber ms lquidos y comer ms alimentos ricos en fibra. El cambio en el estilo de vida incluye hacer ejercicios de manera regular. Si estas recomendaciones para realizar cambios en la dieta y en el estilo de vida no ayudan, el mdico le puede indicar el uso de laxantes de venta libre  para ayudarlo a defecar. Los medicamentos recetados se pueden prescribir si los medicamentos de venta libre no lo ayudan.  INSTRUCCIONES PARA EL CUIDADO EN EL HOGAR   Consuma alimentos con alto contenido de fibra, como frutas, vegetales, cereales integrales y porotos.  Limite los alimentos procesados ricos en grasas y azcar, como las papas fritas, hamburguesas, galletas, dulces y refrescos.  Puede agregar un suplemento de fibra a su dieta si no obtiene lo suficiente de los alimentos.  Beba suficiente lquido para mantener la orina clara o de color amarillo plido.  Haga ejercicio regularmente o segn las indicaciones del mdico.  Vaya al bao cuando sienta la necesidad de ir. No se aguante las ganas.  Tome solo medicamentos de venta libre o recetados, segn las indicaciones del mdico. No tome otros medicamentos para el estreimiento sin consultarlo antes con su mdico. SOLICITE ATENCIN MDICA DE INMEDIATO SI:   Observa sangre brillante en las heces.  El estreimiento dura ms de 4 das o empeora.  Siente dolor abdominal o rectal.  Las heces son delgadas como un lpiz.  Pierde peso de manera inexplicable. ASEGRESE DE QUE:   Comprende estas instrucciones.  Controlar su afeccin.  Recibir ayuda de inmediato si no mejora o si empeora.   Esta informacin no tiene como fin reemplazar el consejo del mdico. Asegrese de hacerle al mdico cualquier pregunta que tenga.   Document Released: 08/22/2007 Document Revised: 08/23/2014 Elsevier Interactive Patient Education 2016 Elsevier Inc.  

## 2016-04-09 ENCOUNTER — Encounter: Payer: Self-pay | Admitting: *Deleted

## 2016-12-29 ENCOUNTER — Encounter: Payer: Self-pay | Admitting: Gynecology

## 2017-01-17 ENCOUNTER — Other Ambulatory Visit: Payer: Self-pay | Admitting: Obstetrics & Gynecology

## 2017-01-17 DIAGNOSIS — N806 Endometriosis in cutaneous scar: Secondary | ICD-10-CM

## 2017-03-10 NOTE — Telephone Encounter (Signed)
See note

## 2017-04-15 IMAGING — CR DG ABDOMEN 1V
1 series · 1 of 1 positions shown · non-contrast
Comparison: Prior CT from 01/14/2016.

CLINICAL DATA: Initial evaluation for postoperative nausea and
vomiting, abdominal pain.

EXAM:
ABDOMEN - 1 VIEW

[abdomen kub]
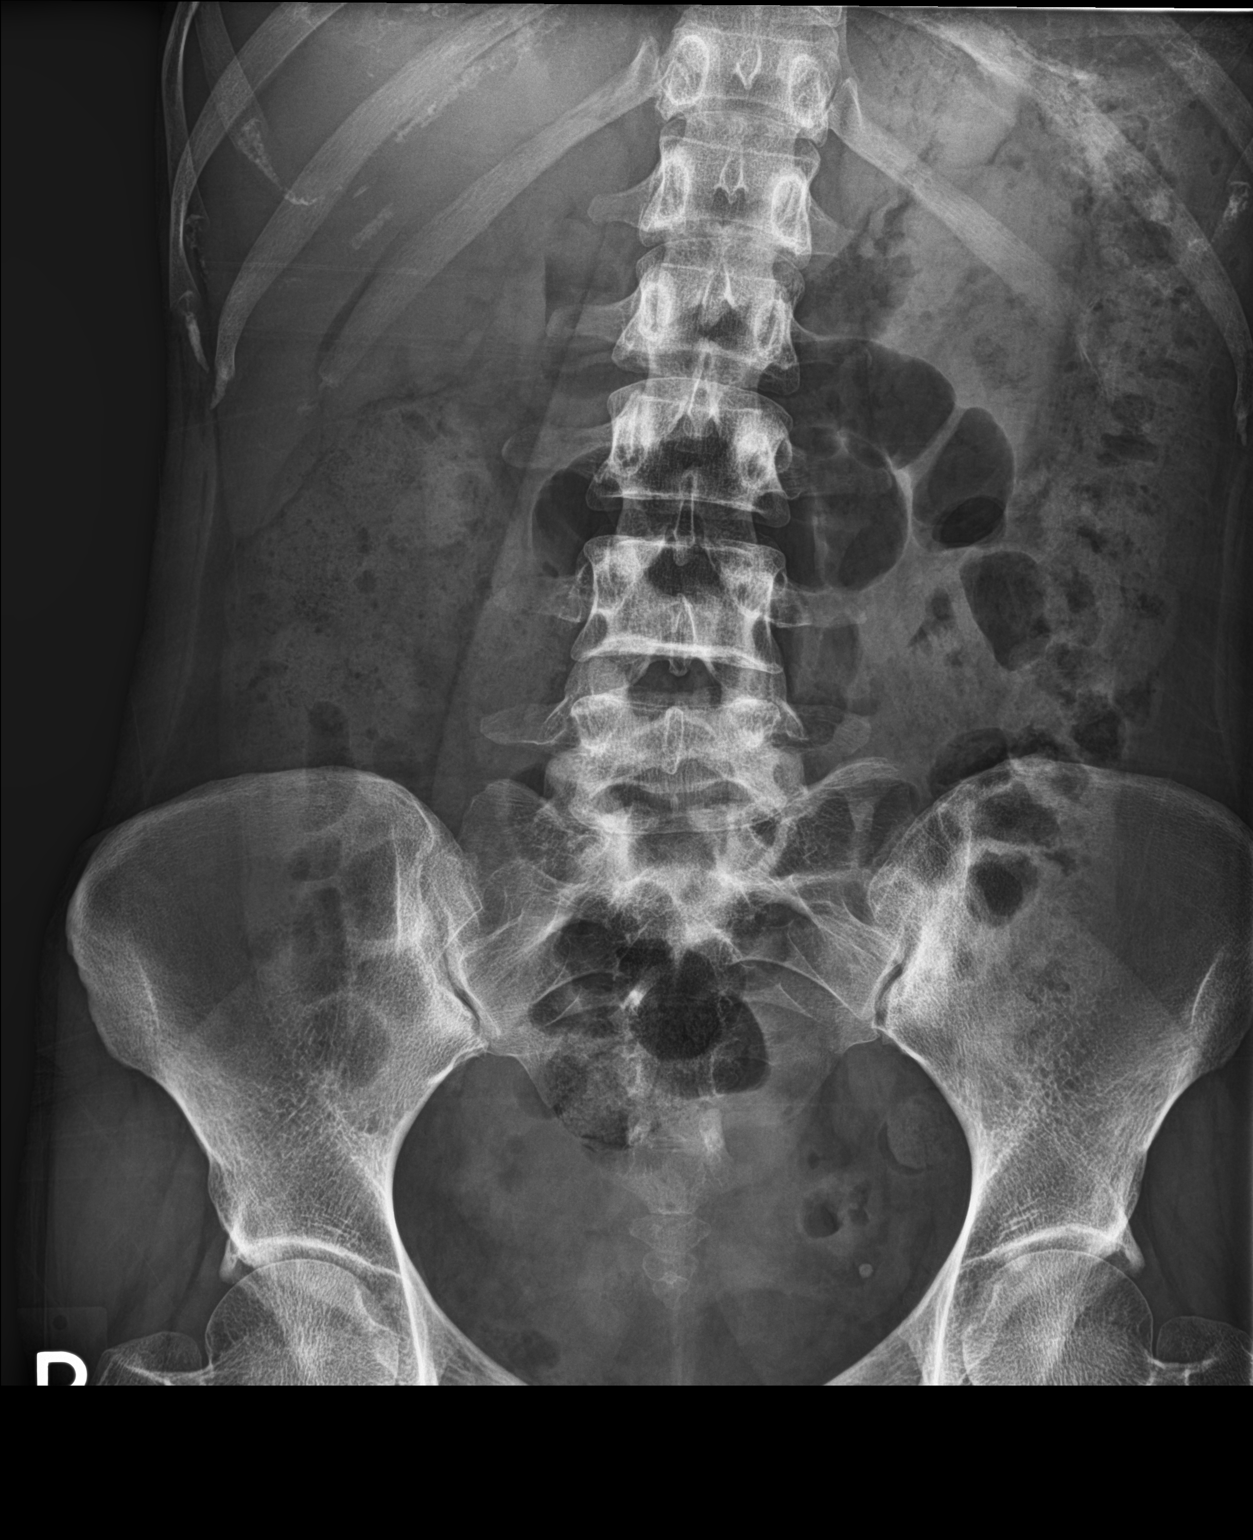

[1 of 1 positions shown; findings below may reference images not displayed]

FINDINGS: Bowel gas pattern within normal limits without evidence for
obstruction or ileus. No abnormal bowel wall thickening. No free air
identified on this limited supine view the abdomen. No soft tissue
mass or abnormal calcification. Pelvic phlebolith overlies the left
pelvic inlet. Moderate amount retained stool within the colon,
suggesting constipation.

Visualized osseous structures demonstrate no acute abnormality. Mild
degenerative osteoarthritic changes noted about the hips.
IMPRESSION: 1. Nonobstructive bowel gas pattern with no radiographic evidence
for acute intra-abdominal process.
2. Moderate amount of retained stool within the colon, suggesting
constipation.

## 2017-11-29 ENCOUNTER — Inpatient Hospital Stay (HOSPITAL_COMMUNITY)
Admission: AD | Admit: 2017-11-29 | Discharge: 2017-11-30 | Disposition: A | Discharge status: Home / Self Care | Payer: Self-pay | Source: Ambulatory Visit | Attending: Obstetrics and Gynecology | Admitting: Obstetrics and Gynecology

## 2017-11-29 ENCOUNTER — Encounter (HOSPITAL_COMMUNITY): Payer: Self-pay | Admitting: *Deleted

## 2017-11-29 DIAGNOSIS — R102 Pelvic and perineal pain: Secondary | ICD-10-CM | POA: Insufficient documentation

## 2017-11-29 DIAGNOSIS — O26899 Other specified pregnancy related conditions, unspecified trimester: Secondary | ICD-10-CM | POA: Insufficient documentation

## 2017-11-29 DIAGNOSIS — Z3A00 Weeks of gestation of pregnancy not specified: Secondary | ICD-10-CM | POA: Insufficient documentation

## 2017-11-29 DIAGNOSIS — Z5321 Procedure and treatment not carried out due to patient leaving prior to being seen by health care provider: Secondary | ICD-10-CM | POA: Insufficient documentation

## 2017-11-29 LAB — CBC
HCT: 34 % — ABNORMAL LOW (ref 36.0–46.0)
Hemoglobin: 12 g/dL (ref 12.0–15.0)
MCH: 31.3 pg (ref 26.0–34.0)
MCHC: 35.3 g/dL (ref 30.0–36.0)
MCV: 88.8 fL (ref 78.0–100.0)
PLATELETS: 175 10*3/uL (ref 150–400)
RBC: 3.83 MIL/uL — ABNORMAL LOW (ref 3.87–5.11)
RDW: 13 % (ref 11.5–15.5)
WBC: 10 10*3/uL (ref 4.0–10.5)

## 2017-11-29 LAB — HCG, QUANTITATIVE, PREGNANCY: HCG, BETA CHAIN, QUANT, S: 111145 m[IU]/mL — AB (ref <5)

## 2017-11-29 NOTE — MAU Note (Signed)
PT SAYS WITH INTERPRETER-   SHE HAS PAIN WHEN SHE WALKS  ON RIGHT SIDE .   NO MEDS FOR PAIN.     WENT TO HD ON 3-27- TO CONFIRM   PREG.   NO VAG BLEEDING.

## 2017-11-30 ENCOUNTER — Inpatient Hospital Stay (HOSPITAL_COMMUNITY): Payer: Self-pay

## 2017-11-30 LAB — HIV ANTIBODY (ROUTINE TESTING W REFLEX): HIV Screen 4th Generation wRfx: NONREACTIVE

## 2017-11-30 NOTE — MAU Note (Signed)
Not in lobby

## 2017-11-30 NOTE — MAU Note (Signed)
NOT IN LOBBY 

## 2017-12-01 ENCOUNTER — Inpatient Hospital Stay (HOSPITAL_COMMUNITY)
Admission: AD | Admit: 2017-12-01 | Discharge: 2017-12-01 | Disposition: A | Discharge status: Home / Self Care | Payer: Self-pay | Source: Ambulatory Visit | Attending: Family Medicine | Admitting: Family Medicine

## 2017-12-01 ENCOUNTER — Encounter (HOSPITAL_COMMUNITY): Payer: Self-pay | Admitting: *Deleted

## 2017-12-01 ENCOUNTER — Other Ambulatory Visit: Payer: Self-pay

## 2017-12-01 DIAGNOSIS — Z3A08 8 weeks gestation of pregnancy: Secondary | ICD-10-CM | POA: Insufficient documentation

## 2017-12-01 DIAGNOSIS — Z3491 Encounter for supervision of normal pregnancy, unspecified, first trimester: Secondary | ICD-10-CM

## 2017-12-01 DIAGNOSIS — O209 Hemorrhage in early pregnancy, unspecified: Secondary | ICD-10-CM | POA: Insufficient documentation

## 2017-12-01 LAB — WET PREP, GENITAL
CLUE CELLS WET PREP: NONE SEEN
Sperm: NONE SEEN
TRICH WET PREP: NONE SEEN
Yeast Wet Prep HPF POC: NONE SEEN

## 2017-12-01 NOTE — MAU Note (Signed)
Pt reports she had some spotting when wiping today. Was here 2 days a go for pain but was unable to stay for evaluation . Still has a little pain today but was worse 2 days ago.

## 2017-12-01 NOTE — Discharge Instructions (Signed)
Primer trimestre de embarazo °(First Trimester of Pregnancy) °El primer trimestre de embarazo se extiende desde la semana 1 hasta el final de la semana 12 (mes 1 al mes 3). Durante este tiempo, el bebé comenzará a desarrollarse dentro suyo. Entre la semana 6 y la 8, se forman los ojos y el rostro, y los latidos del corazón pueden verse en la ecografía. Al final de las 12 semanas, todos los órganos del bebé están formados. La atención prenatal es toda la asistencia médica que usted recibe antes del nacimiento del bebé. Asegúrese de recibir una buena atención prenatal y de seguir todas las indicaciones del médico. °CUIDADOS EN EL HOGAR °Medicamentos: °· Tome los medicamentos solamente como se lo haya indicado el médico. Algunos medicamentos se pueden tomar durante el embarazo y otros no. °· Tome las vitaminas prenatales como se lo haya indicado el médico. °· Tome el medicamento que la ayuda a defecar (laxante suave) según sea necesario, si el médico lo autoriza. °Dieta °· Ingiera alimentos saludables de manera regular. °· El médico le indicará la cantidad de peso que puede aumentar. °· No coma carne cruda ni quesos sin cocinar. °· Si tiene malestar estomacal (náuseas) o vomita: °? Ingiera 4 o 5 comidas pequeñas por día en lugar de 3 abundantes. °? Intente comer algunas galletitas saladas. °? Beba líquidos entre las comidas, en lugar de hacerlo durante estas. °· Si tiene dificultad para defecar (estreñimiento): °? Consuma alimentos con alto contenido de fibra, como verduras y frutas frescos, y cereales integrales. °? Beba suficiente líquido para mantener el pis (orina) claro o de color amarillo pálido. °Actividad y ejercicios °· Haga ejercicios solamente como se lo haya indicado el médico. Deje de hacer ejercicios si tiene cólicos o dolor en la parte baja del vientre (abdomen) o en la cintura. °· Intente no estar de pie durante mucho tiempo. Mueva las piernas con frecuencia si debe estar de pie en un lugar durante  mucho tiempo. °· Evite levantar pesos excesivos. °· Use zapatos con tacones bajos. Mantenga una buena postura al sentarse y pararse. °· Puede tener relaciones sexuales, a menos que el médico le indique lo contrario. °Alivio del dolor o las molestias °· Use un sostén que le brinde buen soporte si le duelen las mamas. °· Dese baños con agua tibia (baños de asiento) para aliviar el dolor o las molestias a causa de las hemorroides. Use crema antihemorroidal si el médico se lo permite. °· Descanse con las piernas elevadas si tiene calambres o dolor de cintura. °· Use medias de descanso si tiene las venas de las piernas hinchadas y abultadas (venas varicosas). Eleve los pies durante 15 minutos, 3 o 4 veces por día. Limite la cantidad de sal en su dieta. °Cuidados prenatales °· Programe las visitas prenatales para la semana 12 de embarazo. °· Escriba sus preguntas. Llévelas cuando concurra a las visitas prenatales. °· Concurra a todas las visitas prenatales como se lo haya indicado el médico. °Seguridad °· Colóquese el cinturón de seguridad cuando conduzca. °· Haga una lista con los números de teléfono en caso de emergencia, en la cual deben incluirse los números de los familiares, los amigos, el hospital y los departamentos de policía y de bomberos. °Consejos generales °· Pídale al médico que la derive a clases prenatales en su localidad. Debe comenzar a tomar las clases antes de entrar en el mes 6 de embarazo. °· Pida ayuda si necesita asesoramiento o asistencia con la alimentación. El médico puede aconsejarla o indicarle dónde recurrir para recibir ayuda. °· No se dé baños de inmersión en agua caliente, baños   turcos ni saunas.  No se haga duchas vaginales ni use tampones o toallas higinicas perfumadas.  No mantenga las piernas cruzadas durante [place] tiempo.  Evite el contacto con las bandejas sanitarias de los gatos y la tierra que estos animales usan.  No fume, no consuma hierbas ni beba alcohol. No tome  frmacos que el mdico no haya autorizado.  No consuma ningn producto que contenga tabaco, lo que incluye cigarrillos, tabaco de Theatre managermascar o Administrator, Civil Servicecigarrillos electrnicos. Si necesita ayuda para dejar de fumar, consulte al American Expressmdico. Puede recibir asesoramiento u otro tipo de apoyo para dejar de fumar.  Visite al dentista. En su casa, lvese los dientes con un cepillo dental suave. Psese el hilo dental con suavidad. SOLICITE AYUDA SI:  Tiene mareos.  Tiene clicos leves o siente presin en la parte baja del vientre.  Siente un dolor persistente en la zona del vientre.  Sigue teniendo AT&Tmalestar estomacal, vomita o las heces son lquidas (diarrea).  Observa una secrecin, con mal olor que proviene de la vagina.  Siente dolor al ConocoPhillipsorinar.  Tiene el rostro, las Viningmanos, las piernas o los tobillos ms hinchados (inflamados).  SOLICITE AYUDA DE INMEDIATO SI:  Tiene fiebre.  Tiene una prdida de lquido por la vagina.  Tiene sangrado o pequeas prdidas vaginales.  Tiene clicos o dolor muy intensos en el vientre.  Sube o baja de peso rpidamente.  Vomita sangre. Puede ser similar a la borra del caf  Est en contacto con personas que tienen rubola, la quinta enfermedad o varicela.  Siente un dolor de cabeza muy intenso.  Le falta el aire.  Sufre cualquier tipo de traumatismo, por ejemplo, debido a una cada o un accidente automovilstico.  Esta informacin no tiene Theme park managercomo fin reemplazar el consejo del mdico. Asegrese de hacerle al mdico cualquier pregunta que tenga. Document Released: 10/29/2008 Document Revised: 08/23/2014 Document Reviewed: 06/12/2013 Elsevier Interactive Patient Education  2017 Elsevier Inc.  Reposo plvico (Pelvic Rest) CUNDO SE RECOMIENDA EL REPOSO PLVICO? El reposo plvico puede recomendarse en los siguientes casos:  La placenta cubre de forma parcial o total la abertura del cuello del tero (placenta previa).  Hay sangrado entre la pared del tero y el  saco amnitico en el primer trimestre de Psychiatristembarazo (hemorragia subcorinica).  El Comptchetrabajo de parto comienza muy pronto (trabajo de parto prematuro). Segn la salud general de la madre y el feto, el mdico decidir si el reposo plvico es [place]elladecuado. CMO HAGO REPOSO PLVICO? Durante el tiempo que le indique el mdico:  No tenga relaciones sexuales, estimulacin sexual ni orgasmos.  No use tampones. No se haga duchas vaginales. No se introduzca nada en la vagina.  No levante ningn objeto que pese ms de 10libras (4,5kg).  Evite las actividades que demanden mucho esfuerzo (extenuantes).  Evite las actividades que requieran esfuerzos de los msculos de la pelvis. CUNDO DEBO BUSCAR ATENCIN MDICA? Solicite atencin mdica de inmediato si:  Tiene clicos en la zona inferior del abdomen.  Tiene secrecin de flujo vaginal.  Tiene un dolor sordo en la parte baja de la espalda.  Tiene contracciones regulares.  Tienen tensin uterina. CUNDO DEBO BUSCAR ASISTENCIA MDICA INMEDIATA? Solicite atencin mdica de inmediato si:  Tiene sangrado vaginal y est embarazada. Esta informacin no tiene Theme park managercomo fin reemplazar el consejo del mdico. Asegrese de hacerle al mdico cualquier pregunta que tenga. Document Released: 04/26/2012 Document Revised: 11/24/2015 Document Reviewed: 02/03/2015 Elsevier Interactive Patient Education  [employer]2018 Elsevier Inc.

## 2017-12-01 NOTE — MAU Provider Note (Signed)
History     CSN: [id number]  Arrival date and time: 12/01/17 1514   First Provider Initiated Contact with Patient 12/01/17 1752      Chief Complaint  Patient presents with  . Vaginal Bleeding   HPI   [name] is a 37 y.o. female [phone] @ [redacted]w[redacted]d here in MAU with vaginal bleeding that started today around 1400 today. The bleeding is light, and she notices only when she wipes. No recent intercourse. No recent vaginal medication use. States she is having some lower abdominal pain that started today also. The pain is worse on the right side. The pain comes and goes. Has a prenatal appointment scheduled In MAY.   OB History    Gravida  3   Para  2   Term  1   Preterm  1   AB  0   Living  2     SAB  0   TAB  0   Ectopic  0   Multiple  0   Live Births  2           Past Medical History:  Diagnosis Date  . Endometriosis in scar of skin   . Medical history non-contributory   . PONV (postoperative nausea and vomiting)     Past Surgical History:  Procedure Laterality Date  . CESAREAN SECTION     X2  . LAPAROTOMY  11/15/2011   Procedure: EXPLORATORY LAPAROTOMY;  Surgeon: Marlane Hatcher, MD;  Location: [facility];  Service: General;  Laterality: N/A;  Exploration and Excision of Endometrial Implant  . MASS EXCISION N/A 04/09/2013   Procedure: EXCISION OF ENDOMETRIOMA;  Surgeon: Marlane Hatcher, MD;  Location: [facility];  Service: General;  Laterality: N/A;  . OVARIAN CYST REMOVAL  03/2016   3 surgeries to remove ovarian cysts    Family History  Problem Relation Age of Onset  . Diabetes Mother   . Cancer Mother        UTERINE   . Cancer Brother        LEUKEMIA  . Anesthesia problems Neg Hx   . Hypotension Neg Hx   . Malignant hyperthermia Neg Hx   . Pseudochol deficiency Neg Hx     Social History   Tobacco Use  . Smoking status: Never Smoker  . Smokeless tobacco: Never Used  Substance Use Topics  . Alcohol use: No    Alcohol/week: 0.0  oz  . Drug use: No    Allergies:  Allergies  Allergen Reactions  . Penicillins Itching and Other (See Comments)    Causes dizziness and burning inside.  Has patient had a PCN reaction causing immediate rash, facial/tongue/throat swelling, SOB or lightheadedness with hypotension: No Has patient had a PCN reaction causing severe rash involving mucus membranes or skin necrosis: No Has patient had a PCN reaction that required hospitalization No Has patient had a PCN reaction occurring within the last 10 years: Yes If all of the above answers are "NO", then may proceed with Cephalosporin use.   . Tylenol [Acetaminophen] Anxiety    Causes itching    Medications Prior to Admission  Medication Sig Dispense Refill Last Dose  . naproxen (NAPROSYN) 500 MG tablet Take 1 tablet (500 mg total) by mouth 2 (two) times daily with a meal. As needed for pain 60 tablet 3   . SPRINTEC 28 0.25-35 MG-MCG tablet TAKE ONE TABLET BY MOUTH ONCE DAILY TAKE  ONLY  ACTIVE  PILLS  IN  A  CONTINUOUS  FASHION 28 tablet 11    Results for orders placed or performed during the hospital encounter of 12/01/17 (from the past 48 hour(s))  Wet prep, genital     Status: Abnormal   Collection Time: 12/01/17  6:08 PM  Result Value Ref Range   Yeast Wet Prep HPF POC NONE SEEN NONE SEEN   Trich, Wet Prep NONE SEEN NONE SEEN   Clue Cells Wet Prep HPF POC NONE SEEN NONE SEEN   WBC, Wet Prep HPF POC MODERATE (A) NONE SEEN   Sperm NONE SEEN     Comment: Performed at [facility], [address] [place], [place] [id number]    Review of Systems  Constitutional: Negative for fever.  Gastrointestinal: Positive for abdominal pain.  Genitourinary: Positive for vaginal bleeding. Negative for dysuria.   Physical Exam   Blood pressure 103/64, pulse 83, temperature 98.3 F (36.8 C), resp. rate 18, height 5\' 4"  (1.626 m), weight 131 lb (59.4 kg), last menstrual period 10/03/2017.  Physical Exam  Constitutional: She is  oriented to person, place, and time. She appears well-developed and well-nourished. No distress.  Respiratory: Effort normal.  GI: Soft. She exhibits no distension. There is no tenderness. There is no rebound and no guarding.  Genitourinary:  Genitourinary Comments: Bimanual exam: Cervix closed, posterior  Uterus non tender, enlarged  Adnexa non tender GC/Chlam, wet prep done collected without speculum. Small amount of brown discharge noted on swabs Chaperone present for exam.   Musculoskeletal: Normal range of motion.  Neurological: She is alert and oriented to person, place, and time.  Skin: Skin is warm. She is not diaphoretic.  Psychiatric: Her behavior is normal.    MAU Course  Procedures  None  MDM  B positive blood type  Limited transabdominal US done by NP. Active fetus with HR in 140's.   Assessment and Plan   A:  1. Vaginal bleeding in pregnancy, first trimester   2. Fetal heart tones present, first trimester     P:  Discharge home in stable condition Pelvic rest Bleeding precautions Return to MAU if symptoms worsen Keep scheduled OB appointment   Jed Kutch, Harolyn RutherfordJennifer I, NP 12/02/2017 8:31 AM

## 2017-12-02 LAB — GC/CHLAMYDIA PROBE AMP (~~LOC~~) NOT AT ARMC
Chlamydia: NEGATIVE
Neisseria Gonorrhea: NEGATIVE

## 2017-12-26 ENCOUNTER — Encounter: Payer: Self-pay | Admitting: Certified Nurse Midwife

## 2017-12-26 ENCOUNTER — Ambulatory Visit (INDEPENDENT_AMBULATORY_CARE_PROVIDER_SITE_OTHER): Payer: Self-pay | Admitting: Certified Nurse Midwife

## 2017-12-26 VITALS — BP 106/71 | HR 82 | Ht 63.0 in | Wt 135.5 lb

## 2017-12-26 DIAGNOSIS — O09211 Supervision of pregnancy with history of pre-term labor, first trimester: Secondary | ICD-10-CM

## 2017-12-26 DIAGNOSIS — O09219 Supervision of pregnancy with history of pre-term labor, unspecified trimester: Secondary | ICD-10-CM

## 2017-12-26 DIAGNOSIS — O09899 Supervision of other high risk pregnancies, unspecified trimester: Secondary | ICD-10-CM

## 2017-12-26 DIAGNOSIS — O09521 Supervision of elderly multigravida, first trimester: Secondary | ICD-10-CM

## 2017-12-26 DIAGNOSIS — O0991 Supervision of high risk pregnancy, unspecified, first trimester: Secondary | ICD-10-CM

## 2017-12-26 DIAGNOSIS — Z98891 History of uterine scar from previous surgery: Secondary | ICD-10-CM

## 2017-12-26 DIAGNOSIS — Z1151 Encounter for screening for human papillomavirus (HPV): Secondary | ICD-10-CM

## 2017-12-26 DIAGNOSIS — O09529 Supervision of elderly multigravida, unspecified trimester: Secondary | ICD-10-CM | POA: Insufficient documentation

## 2017-12-26 DIAGNOSIS — Z124 Encounter for screening for malignant neoplasm of cervix: Secondary | ICD-10-CM

## 2017-12-26 DIAGNOSIS — O0990 Supervision of high risk pregnancy, unspecified, unspecified trimester: Secondary | ICD-10-CM

## 2017-12-26 LAB — POCT URINALYSIS DIP (DEVICE)
Bilirubin Urine: NEGATIVE
Glucose, UA: NEGATIVE mg/dL
Hgb urine dipstick: NEGATIVE
Ketones, ur: NEGATIVE mg/dL
Leukocytes, UA: NEGATIVE
Nitrite: NEGATIVE
Protein, ur: NEGATIVE mg/dL
Specific Gravity, Urine: >=1.03 (ref 1.005–1.030)
Urobilinogen, UA: 0.2 mg/dL (ref 0.0–1.0)
pH: 6.5 (ref 5.0–8.0)

## 2017-12-26 NOTE — Patient Instructions (Signed)
Primer trimestre de embarazo °(First Trimester of Pregnancy) °El primer trimestre de embarazo se extiende desde la semana 1 hasta el final de la semana 12 (mes 1 al mes 3). Durante este tiempo, el bebé comenzará a desarrollarse dentro suyo. Entre la semana 6 y la 8, se forman los ojos y el rostro, y los latidos del corazón pueden verse en la ecografía. Al final de las 12 semanas, todos los órganos del bebé están formados. La atención prenatal es toda la asistencia médica que usted recibe antes del nacimiento del bebé. Asegúrese de recibir una buena atención prenatal y de seguir todas las indicaciones del médico. °CUIDADOS EN EL HOGAR °Medicamentos: °· Tome los medicamentos solamente como se lo haya indicado el médico. Algunos medicamentos se pueden tomar durante el embarazo y otros no. °· Tome las vitaminas prenatales como se lo haya indicado el médico. °· Tome el medicamento que la ayuda a defecar (laxante suave) según sea necesario, si el médico lo autoriza. °Dieta °· Ingiera alimentos saludables de manera regular. °· El médico le indicará la cantidad de peso que puede aumentar. °· No coma carne cruda ni quesos sin cocinar. °· Si tiene malestar estomacal (náuseas) o vomita: °? Ingiera 4 o 5 comidas pequeñas por día en lugar de 3 abundantes. °? Intente comer algunas galletitas saladas. °? Beba líquidos entre las comidas, en lugar de hacerlo durante estas. °· Si tiene dificultad para defecar (estreñimiento): °? Consuma alimentos con alto contenido de fibra, como verduras y frutas frescos, y cereales integrales. °? Beba suficiente líquido para mantener el pis (orina) claro o de color amarillo pálido. °Actividad y ejercicios °· Haga ejercicios solamente como se lo haya indicado el médico. Deje de hacer ejercicios si tiene cólicos o dolor en la parte baja del vientre (abdomen) o en la cintura. °· Intente no estar de pie durante mucho tiempo. Mueva las piernas con frecuencia si debe estar de pie en un lugar durante  mucho tiempo. °· Evite levantar pesos excesivos. °· Use zapatos con tacones bajos. Mantenga una buena postura al sentarse y pararse. °· Puede tener relaciones sexuales, a menos que el médico le indique lo contrario. °Alivio del dolor o las molestias °· Use un sostén que le brinde buen soporte si le duelen las mamas. °· Dese baños con agua tibia (baños de asiento) para aliviar el dolor o las molestias a causa de las hemorroides. Use crema antihemorroidal si el médico se lo permite. °· Descanse con las piernas elevadas si tiene calambres o dolor de cintura. °· Use medias de descanso si tiene las venas de las piernas hinchadas y abultadas (venas varicosas). Eleve los pies durante 15 minutos, 3 o 4 veces por día. Limite la cantidad de sal en su dieta. °Cuidados prenatales °· Programe las visitas prenatales para la semana 12 de embarazo. °· Escriba sus preguntas. Llévelas cuando concurra a las visitas prenatales. °· Concurra a todas las visitas prenatales como se lo haya indicado el médico. °Seguridad °· Colóquese el cinturón de seguridad cuando conduzca. °· Haga una lista con los números de teléfono en caso de emergencia, en la cual deben incluirse los números de los familiares, los amigos, el hospital y los departamentos de policía y de bomberos. °Consejos generales °· Pídale al médico que la derive a clases prenatales en su localidad. Debe comenzar a tomar las clases antes de entrar en el mes 6 de embarazo. °· Pida ayuda si necesita asesoramiento o asistencia con la alimentación. El médico puede aconsejarla o indicarle dónde recurrir para recibir ayuda. °· No se dé baños de inmersión en agua caliente, baños   turcos ni saunas. °· No se haga duchas vaginales ni use tampones o toallas higiénicas perfumadas. °· No mantenga las piernas cruzadas durante mucho tiempo. °· Evite el contacto con las bandejas sanitarias de los gatos y la tierra que estos animales usan. °· No fume, no consuma hierbas ni beba alcohol. No tome  fármacos que el médico no haya autorizado. °· No consuma ningún producto que contenga tabaco, lo que incluye cigarrillos, tabaco de mascar o cigarrillos electrónicos. Si necesita ayuda para dejar de fumar, consulte al médico. Puede recibir asesoramiento u otro tipo de apoyo para dejar de fumar. °· Visite al dentista. En su casa, lávese los dientes con un cepillo dental suave. Pásese el hilo dental con suavidad. °SOLICITE AYUDA SI: °· Tiene mareos. °· Tiene cólicos leves o siente presión en la parte baja del vientre. °· Siente un dolor persistente en la zona del vientre. °· Sigue teniendo malestar estomacal, vomita o las heces son líquidas (diarrea). °· Observa una secreción, con mal olor que proviene de la vagina. °· Siente dolor al orinar. °· Tiene el rostro, las manos, las piernas o los tobillos más hinchados (inflamados). ° °SOLICITE AYUDA DE INMEDIATO SI: °· Tiene fiebre. °· Tiene una pérdida de líquido por la vagina. °· Tiene sangrado o pequeñas pérdidas vaginales. °· Tiene cólicos o dolor muy intensos en el vientre. °· Sube o baja de peso rápidamente. °· Vomita sangre. Puede ser similar a la borra del café °· Está en contacto con personas que tienen rubéola, la quinta enfermedad o varicela. °· Siente un dolor de cabeza muy intenso. °· Le falta el aire. °· Sufre cualquier tipo de traumatismo, por ejemplo, debido a una caída o un accidente automovilístico. ° °Esta información no tiene como fin reemplazar el consejo del médico. Asegúrese de hacerle al médico cualquier pregunta que tenga. °Document Released: 10/29/2008 Document Revised: 08/23/2014 Document Reviewed: 06/12/2013 °Elsevier Interactive Patient Education © 2017 Elsevier Inc. ° °

## 2017-12-26 NOTE — Progress Notes (Signed)
Here for new ob visit. States was taking ocp's but missed 2 and then found out was pregnant. Given new patient education booklet.  Declined MyChart. C/o varicose veins. Declined flu shot. Declines SM test.c/o some pain near c/s incision. Declined Dentist.

## 2017-12-26 NOTE — Progress Notes (Signed)
Subjective:   [name] is a 37 y.o. [id number] at [redacted]w[redacted]d by LMP being seen today for her first obstetrical visit.  Her obstetrical history is significant for previous classical cesarean section. Patient does intend to breast feed. Pregnancy history fully reviewed.  Patient reports intermitting pulling sensation at C/S incision, reports it last for seconds then pain goes away. Marland Kitchen  HISTORY: OB History  Gravida Para Term Preterm AB Living  3 2 0 2 0 2  SAB TAB Ectopic Multiple Live Births  0 0 0 0 2    # Outcome Date GA Lbr Len/2nd Weight Sex Delivery Anes PTL Lv  3 Current           2 Preterm 2012 [redacted]w[redacted]d  6 lb (2.722 kg)  CS-LTranv   LIV     Birth Comments: repeat c/s before labor because of c/s scar was vertical   1 Preterm 2008 [redacted]w[redacted]d  2 lb (0.907 kg) F CS-Unspec   LIV     Birth Comments: states had problem with fluid and placenta, had bleeding , had c/s at 6 months     Last pap smear was done 2016 and was normal  Past Medical History:  Diagnosis Date  . Endometriosis in scar of skin   . Medical history non-contributory   . PONV (postoperative nausea and vomiting)    Past Surgical History:  Procedure Laterality Date  . CESAREAN SECTION     X2  . LAPAROTOMY  11/15/2011   Procedure: EXPLORATORY LAPAROTOMY;  Surgeon: Marlane Hatcher, MD;  Location: [facility];  Service: General;  Laterality: N/A;  Exploration and Excision of Endometrial Implant  . MASS EXCISION N/A 04/09/2013   Procedure: EXCISION OF ENDOMETRIOMA;  Surgeon: Marlane Hatcher, MD;  Location: [facility];  Service: General;  Laterality: N/A;  . OVARIAN CYST REMOVAL  03/2016   3 surgeries to remove ovarian cysts   Family History  Problem Relation Age of Onset  . Diabetes Mother   . Cancer Mother        UTERINE   . Cancer Brother        LEUKEMIA  . Anesthesia problems Neg Hx   . Hypotension Neg Hx   . Malignant hyperthermia Neg Hx   . Pseudochol deficiency Neg Hx    Social History   Tobacco Use  .  Smoking status: Never Smoker  . Smokeless tobacco: Never Used  Substance Use Topics  . Alcohol use: No    Alcohol/week: 0.0 oz  . Drug use: No   Allergies  Allergen Reactions  . Penicillins Itching and Other (See Comments)    Causes dizziness and burning inside.  Has patient had a PCN reaction causing immediate rash, facial/tongue/throat swelling, SOB or lightheadedness with hypotension: No Has patient had a PCN reaction causing severe rash involving mucus membranes or skin necrosis: No Has patient had a PCN reaction that required hospitalization No Has patient had a PCN reaction occurring within the last 10 years: Yes If all of the above answers are "NO", then may proceed with Cephalosporin use.   . Tylenol [Acetaminophen] Anxiety    Causes itching   Current Outpatient Medications on File Prior to Visit  Medication Sig Dispense Refill  . Prenatal Vit-Fe Fumarate-FA (PRENATAL MULTIVITAMIN) TABS tablet Take 1 tablet by mouth daily at 12 noon.     No current facility-administered medications on file prior to visit.     Review of Systems Pertinent items noted in HPI and remainder of  comprehensive ROS otherwise negative.  Exam   Vitals:   12/26/17 1554 12/26/17 1555  BP: 106/71   Pulse: 82   Weight: 135 lb 8 oz (61.5 kg)   Height:   (1.6 m)   Fetal Heart Rate (bpm): 145  Pelvic Exam: Perineum: no hemorrhoids, normal perineum   Vulva: normal external genitalia, no lesions   Vagina:  normal mucosa, normal discharge   Cervix: no lesions and normal, pap smear done.    Adnexa: normal adnexa and no mass, fullness, tenderness   Bony Pelvis: average  System: General: well-developed, well-nourished female in no acute distress   Breast:  normal appearance, no masses or tenderness   Skin: normal coloration and turgor, no rashes   Neurologic: oriented, normal, negative, normal mood   Extremities: normal strength, tone, and muscle mass, ROM of all joints is normal   HEENT  PERRLA, extraocular movement intact and sclera clear.   Mouth/Teeth mucous membranes moist, pharynx normal without lesions and dental hygiene good   Neck supple and no masses   Cardiovascular: regular rate and rhythm   Respiratory:  no respiratory distress, normal breath sounds   Abdomen: soft, non-tender; bowel sounds normal; no masses,  no organomegaly     Assessment:   Pregnancy: [id number] Patient Active Problem List   Diagnosis Date Noted  . Supervision of high risk pregnancy, antepartum 12/26/2017  . History of preterm delivery, currently pregnant 12/26/2017  . History of cesarean section, classical 12/26/2017  . AMA (advanced maternal age) multigravida 35+ 12/26/2017  . Endometriosis in scar of skin 10/25/2013  . Dyspareunia 10/25/2013     Plan:  1. Supervision of high risk pregnancy, antepartum - [facility] AMB BABYSCRIPTS OPT IN - Obstetric Panel, Including HIV - Hemoglobinopathy Evaluation - Culture, OB Urine - Cytology - PAP - [place] MFM OB DETAIL +14 WK; Future  2. History of cesarean section, classical -Classical C/S at 29 weeks for first pregnancy, repeat C/S for second pregnancy  -educated on reasons to call or return to MAU with increased abdominal pain at C/S incision  - [place] MFM OB DETAIL +14 WK; Future  3. History of preterm delivery, currently pregnant -Classical C/S at 29 weeks for placental abruption with BPP 2/8  - [place] MFM OB DETAIL +14 WK; Future  4. Antepartum multigravida of advanced maternal age -patient will be 37 years old at time of delivery  - AMB MFM GENETICS REFERRAL   Initial labs drawn. Continue prenatal vitamins. Genetic Screening discussed, First trimester screen, Quad screen and NIPS: declined. Ultrasound discussed; fetal anatomic survey: ordered. Problem list reviewed and updated. The nature of [facility] with multiple MDs and other Advanced Practice Providers was explained to patient; also emphasized that  residents, students are part of our team. Routine obstetric precautions reviewed. Return in about 1 month (around 01/23/2018), or HROB.     Steward Drone CNM  Center for [employer]

## 2017-12-28 LAB — OBSTETRIC PANEL, INCLUDING HIV
Antibody Screen: NEGATIVE
Basophils Absolute: 0 10*3/uL (ref 0.0–0.2)
Basos: 0 %
EOS (ABSOLUTE): 0.2 10*3/uL (ref 0.0–0.4)
Eos: 2 %
HIV Screen 4th Generation wRfx: NONREACTIVE
Hematocrit: 32.7 % — ABNORMAL LOW (ref 34.0–46.6)
Hemoglobin: 11.4 g/dL (ref 11.1–15.9)
Hepatitis B Surface Ag: NEGATIVE
Immature Grans (Abs): 0 10*3/uL (ref 0.0–0.1)
Immature Granulocytes: 0 %
Lymphocytes Absolute: 1 10*3/uL (ref 0.7–3.1)
Lymphs: 13 %
MCH: 31.7 pg (ref 26.6–33.0)
MCHC: 34.9 g/dL (ref 31.5–35.7)
MCV: 91 fL (ref 79–97)
Monocytes Absolute: 0.3 10*3/uL (ref 0.1–0.9)
Monocytes: 3 %
Neutrophils Absolute: 6.5 10*3/uL (ref 1.4–7.0)
Neutrophils: 82 %
Platelets: 163 10*3/uL (ref 150–379)
RBC: 3.6 x10E6/uL — ABNORMAL LOW (ref 3.77–5.28)
RDW: 13.6 % (ref 12.3–15.4)
RPR Ser Ql: NONREACTIVE
Rh Factor: POSITIVE
Rubella Antibodies, IGG: 8.03 index (ref >0.99)
WBC: 8 10*3/uL (ref 3.4–10.8)

## 2017-12-28 LAB — CYTOLOGY - PAP
Diagnosis: NEGATIVE
HPV: NOT DETECTED

## 2017-12-28 LAB — HEMOGLOBINOPATHY EVALUATION
Ferritin: 24 ng/mL (ref 15–150)
Hgb A2 Quant: 2.4 % (ref 1.8–3.2)
Hgb A: 97.6 % (ref 96.4–98.8)
Hgb C: 0 %
Hgb F Quant: 0 % (ref 0.0–2.0)
Hgb S: 0 %
Hgb Solubility: NEGATIVE
Hgb Variant: 0 %

## 2017-12-29 LAB — URINE CULTURE, OB REFLEX

## 2017-12-29 LAB — CULTURE, OB URINE

## 2018-01-25 ENCOUNTER — Ambulatory Visit (INDEPENDENT_AMBULATORY_CARE_PROVIDER_SITE_OTHER): Payer: Self-pay | Admitting: Obstetrics & Gynecology

## 2018-01-25 DIAGNOSIS — O09219 Supervision of pregnancy with history of pre-term labor, unspecified trimester: Secondary | ICD-10-CM

## 2018-01-25 DIAGNOSIS — O09899 Supervision of other high risk pregnancies, unspecified trimester: Secondary | ICD-10-CM

## 2018-01-25 MED ORDER — TRUFORM LITES MATERNITY HOSE MISC: 0 refills | Status: DC

## 2018-01-25 MED ORDER — FLUTICASONE PROPIONATE 50 MCG/ACT NA SUSP: 1.0000 | Freq: Every day | NASAL | 2 refills | Status: AC

## 2018-01-25 NOTE — Progress Notes (Signed)
   PRENATAL VISIT NOTE  Subjective:  [name] is a 37 y.o. [id number] at 3540w2d being seen today for ongoing prenatal care.  She is currently monitored for the following issues for this high-risk pregnancy and has Endometriosis in scar of skin; Dyspareunia; Supervision of high risk pregnancy, antepartum; History of preterm delivery, currently pregnant; History of cesarean section, classical; and AMA (advanced maternal age) multigravida 35+ on their problem list.  Patient reports lower back pain, pain from spider veins on her legs that started one month ago, nasal congestion that started prior to pregnancy that is causing SOB when she is doing housework or speaking.  Contractions: Not present. Vag. Bleeding: None.  Movement: Present. Denies leaking of fluid.   The following portions of the patient's history were reviewed and updated as appropriate: allergies, current medications, past family history, past medical history, past social history, past surgical history and problem list. Problem list updated.  Objective:   Vitals:   01/25/18 1324  BP: (!) 108/54  Pulse: 81  Weight: 138 lb 8 oz (62.8 kg)    Fetal Status: Fetal Heart Rate (bpm): 157   Movement: Present     General:  Alert, oriented and cooperative. Patient is in no acute distress.  Skin: Skin is warm and dry. No rash noted.   Cardiovascular: Normal heart rate noted  Respiratory: Normal respiratory effort, no problems with respiration noted, CTAB, no wheezing crackles, rales  Abdomen: Soft, gravid, appropriate for gestational age.  Pain/Pressure: Absent     Pelvic: Cervical exam deferred        Extremities: Normal range of motion.  Edema: Trace, R leg spider veins present from dorsal foot to mid-shin, L leg patch of varicose veins below knee  Mental Status: Normal mood and affect. Normal behavior. Normal judgment and thought content.   Assessment and Plan:  Pregnancy: [id number] at 6440w2d  1. History of preterm delivery,  currently pregnant - Genetic Screening discussed, First trimester screen, Quad screen and NIPS: declined - [place] MFM OB DETAIL +14 WK, July 2 - Patient requests tubal ligation for birth control  2. History of cesarean section, classical - Classical C/S at 29 weeks for first pregnancy for placental abruption with BPP 2/8, repeat C/S for second pregnancy  - Educated on reasons to call or return to MAU with increased abdominal pain at C/S incision   3. Antepartum multigravida of advanced maternal age - Patient will be 37 years old at time of delivery   4. Spider veins - Compression stockings  5. Nasal Congestion - fluticasone (FLONASE) 50 MCG/ACT nasal spray  Preterm labor symptoms and general obstetric precautions including but not limited to vaginal bleeding, contractions, leaking of fluid and fetal movement were reviewed in detail with the patient. Please refer to After Visit Summary for other counseling recommendations.  Return in about 1 month (around 02/22/2018).  Future Appointments  Date Time Provider Department Center  02/14/2018  8:00 AM [facility] US 3 [facility]  02/14/2018  9:00 AM [facility] GENETIC COUNSELING RM [facility]    Alfonso EllisNga T Margurete Guaman, Medical Student

## 2018-01-25 NOTE — Patient Instructions (Signed)
Second Trimester of Pregnancy The second trimester is from week 13 through week 28, month 4 through 6. This is often the time in pregnancy that you feel your best. Often times, morning sickness has lessened or quit. You may have more energy, and you may get hungry more often. Your unborn baby (fetus) is growing rapidly. At the end of the sixth month, he or she is about 9 inches long and weighs about 1 pounds. You will likely feel the baby move (quickening) between 18 and 20 weeks of pregnancy. Follow these instructions at home:  Avoid all smoking, herbs, and alcohol. Avoid drugs not approved by your doctor.  Do not use any tobacco products, including cigarettes, chewing tobacco, and electronic cigarettes. If you need help quitting, ask your doctor. You may get counseling or other support to help you quit.  Only take medicine as told by your doctor. Some medicines are safe and some are not during pregnancy.  Exercise only as told by your doctor. Stop exercising if you start having cramps.  Eat regular, healthy meals.  Wear a good support bra if your breasts are tender.  Do not use hot tubs, steam rooms, or saunas.  Wear your seat belt when driving.  Avoid raw meat, uncooked cheese, and liter boxes and soil used by cats.  Take your prenatal vitamins.  Take 1500-2000 milligrams of calcium daily starting at the 20th week of pregnancy until you deliver your baby.  Try taking medicine that helps you poop (stool softener) as needed, and if your doctor approves. Eat more fiber by eating fresh fruit, vegetables, and whole grains. Drink enough fluids to keep your pee (urine) clear or pale yellow.  Take warm water baths (sitz baths) to soothe pain or discomfort caused by hemorrhoids. Use hemorrhoid cream if your doctor approves.  If you have puffy, bulging veins (varicose veins), wear support hose. Raise (elevate) your feet for 15 minutes, 3-4 times a day. Limit salt in your diet.  Avoid heavy  lifting, wear low heals, and sit up straight.  Rest with your legs raised if you have leg cramps or low back pain.  Visit your dentist if you have not gone during your pregnancy. Use a soft toothbrush to brush your teeth. Be gentle when you floss.  You can have sex (intercourse) unless your doctor tells you not to.  Go to your doctor visits. Get help if:  You feel dizzy.  You have mild cramps or pressure in your lower belly (abdomen).  You have a nagging pain in your belly area.  You continue to feel sick to your stomach (nauseous), throw up (vomit), or have watery poop (diarrhea).  You have bad smelling fluid coming from your vagina.  You have pain with peeing (urination). Get help right away if:  You have a fever.  You are leaking fluid from your vagina.  You have spotting or bleeding from your vagina.  You have severe belly cramping or pain.  You lose or gain weight rapidly.  You have trouble catching your breath and have chest pain.  You notice sudden or extreme puffiness (swelling) of your face, hands, ankles, feet, or legs.  You have not felt the baby move in over an hour.  You have severe headaches that do not go away with medicine.  You have vision changes. This information is not intended to replace advice given to you by your health care provider. Make sure you discuss any questions you have with your health care   provider. Document Released: 10/27/2009 Document Revised: 01/08/2016 Document Reviewed: 10/03/2012 Elsevier Interactive Patient Education  2017 Elsevier Inc.  

## 2018-02-08 ENCOUNTER — Encounter (HOSPITAL_COMMUNITY): Payer: Self-pay

## 2018-02-14 ENCOUNTER — Ambulatory Visit (HOSPITAL_COMMUNITY): Payer: Self-pay

## 2018-02-14 ENCOUNTER — Encounter (HOSPITAL_COMMUNITY): Payer: Self-pay

## 2018-02-15 ENCOUNTER — Encounter: Payer: Self-pay | Admitting: Certified Nurse Midwife

## 2018-02-15 ENCOUNTER — Encounter (HOSPITAL_COMMUNITY): Payer: Self-pay

## 2018-02-15 ENCOUNTER — Ambulatory Visit (HOSPITAL_COMMUNITY)
Admission: RE | Admit: 2018-02-15 | Discharge: 2018-02-15 | Disposition: A | Discharge status: Home / Self Care | Payer: Self-pay | Source: Ambulatory Visit | Attending: Certified Nurse Midwife | Admitting: Certified Nurse Midwife

## 2018-02-15 ENCOUNTER — Other Ambulatory Visit (HOSPITAL_COMMUNITY): Payer: Self-pay | Admitting: *Deleted

## 2018-02-15 ENCOUNTER — Ambulatory Visit (HOSPITAL_COMMUNITY)
Admission: RE | Admit: 2018-02-15 | Discharge: 2018-02-15 | Disposition: A | Discharge status: Home / Self Care | Payer: Self-pay | Source: Ambulatory Visit | Attending: Family Medicine | Admitting: Family Medicine

## 2018-02-15 DIAGNOSIS — Z3A20 20 weeks gestation of pregnancy: Secondary | ICD-10-CM | POA: Insufficient documentation

## 2018-02-15 DIAGNOSIS — O09219 Supervision of pregnancy with history of pre-term labor, unspecified trimester: Secondary | ICD-10-CM

## 2018-02-15 DIAGNOSIS — O09522 Supervision of elderly multigravida, second trimester: Secondary | ICD-10-CM

## 2018-02-15 DIAGNOSIS — O09899 Supervision of other high risk pregnancies, unspecified trimester: Secondary | ICD-10-CM

## 2018-02-15 DIAGNOSIS — Z98891 History of uterine scar from previous surgery: Secondary | ICD-10-CM

## 2018-02-15 DIAGNOSIS — O0990 Supervision of high risk pregnancy, unspecified, unspecified trimester: Secondary | ICD-10-CM

## 2018-02-15 DIAGNOSIS — O09212 Supervision of pregnancy with history of pre-term labor, second trimester: Secondary | ICD-10-CM

## 2018-02-15 DIAGNOSIS — O09529 Supervision of elderly multigravida, unspecified trimester: Secondary | ICD-10-CM

## 2018-02-15 DIAGNOSIS — O0992 Supervision of high risk pregnancy, unspecified, second trimester: Secondary | ICD-10-CM

## 2018-02-15 DIAGNOSIS — Z363 Encounter for antenatal screening for malformations: Secondary | ICD-10-CM | POA: Insufficient documentation

## 2018-02-15 DIAGNOSIS — O43199 Other malformation of placenta, unspecified trimester: Secondary | ICD-10-CM | POA: Insufficient documentation

## 2018-02-15 DIAGNOSIS — O43192 Other malformation of placenta, second trimester: Secondary | ICD-10-CM | POA: Insufficient documentation

## 2018-02-22 ENCOUNTER — Encounter: Payer: Self-pay | Admitting: Obstetrics & Gynecology

## 2018-02-22 ENCOUNTER — Encounter (HOSPITAL_COMMUNITY): Payer: Self-pay | Admitting: *Deleted

## 2018-02-22 ENCOUNTER — Observation Stay (HOSPITAL_COMMUNITY)
Admission: AD | Admit: 2018-02-22 | Discharge: 2018-02-24 | Disposition: A | Discharge status: Home / Self Care | Payer: Self-pay | Source: Ambulatory Visit | Attending: Physician Assistant | Admitting: Physician Assistant

## 2018-02-22 ENCOUNTER — Inpatient Hospital Stay (HOSPITAL_COMMUNITY): Payer: Self-pay

## 2018-02-22 ENCOUNTER — Ambulatory Visit (INDEPENDENT_AMBULATORY_CARE_PROVIDER_SITE_OTHER): Payer: Self-pay | Admitting: Obstetrics & Gynecology

## 2018-02-22 VITALS — BP 104/63 | HR 83 | Wt 142.7 lb

## 2018-02-22 DIAGNOSIS — O43199 Other malformation of placenta, unspecified trimester: Secondary | ICD-10-CM

## 2018-02-22 DIAGNOSIS — O09212 Supervision of pregnancy with history of pre-term labor, second trimester: Secondary | ICD-10-CM

## 2018-02-22 DIAGNOSIS — Z3A21 21 weeks gestation of pregnancy: Secondary | ICD-10-CM

## 2018-02-22 DIAGNOSIS — K432 Incisional hernia without obstruction or gangrene: Secondary | ICD-10-CM | POA: Diagnosis present

## 2018-02-22 DIAGNOSIS — O09522 Supervision of elderly multigravida, second trimester: Secondary | ICD-10-CM | POA: Insufficient documentation

## 2018-02-22 DIAGNOSIS — Z886 Allergy status to analgesic agent status: Secondary | ICD-10-CM | POA: Insufficient documentation

## 2018-02-22 DIAGNOSIS — O43192 Other malformation of placenta, second trimester: Secondary | ICD-10-CM

## 2018-02-22 DIAGNOSIS — O0992 Supervision of high risk pregnancy, unspecified, second trimester: Secondary | ICD-10-CM | POA: Insufficient documentation

## 2018-02-22 DIAGNOSIS — Z806 Family history of leukemia: Secondary | ICD-10-CM | POA: Insufficient documentation

## 2018-02-22 DIAGNOSIS — O0990 Supervision of high risk pregnancy, unspecified, unspecified trimester: Secondary | ICD-10-CM

## 2018-02-22 DIAGNOSIS — R109 Unspecified abdominal pain: Secondary | ICD-10-CM

## 2018-02-22 DIAGNOSIS — O34219 Maternal care for unspecified type scar from previous cesarean delivery: Secondary | ICD-10-CM

## 2018-02-22 DIAGNOSIS — O09529 Supervision of elderly multigravida, unspecified trimester: Secondary | ICD-10-CM

## 2018-02-22 DIAGNOSIS — Z98891 History of uterine scar from previous surgery: Secondary | ICD-10-CM

## 2018-02-22 DIAGNOSIS — K439 Ventral hernia without obstruction or gangrene: Secondary | ICD-10-CM | POA: Diagnosis present

## 2018-02-22 DIAGNOSIS — O99612 Diseases of the digestive system complicating pregnancy, second trimester: Secondary | ICD-10-CM | POA: Insufficient documentation

## 2018-02-22 DIAGNOSIS — O09219 Supervision of pregnancy with history of pre-term labor, unspecified trimester: Secondary | ICD-10-CM

## 2018-02-22 DIAGNOSIS — Z3A20 20 weeks gestation of pregnancy: Secondary | ICD-10-CM | POA: Insufficient documentation

## 2018-02-22 DIAGNOSIS — Z8049 Family history of malignant neoplasm of other genital organs: Secondary | ICD-10-CM | POA: Insufficient documentation

## 2018-02-22 DIAGNOSIS — O09899 Supervision of other high risk pregnancies, unspecified trimester: Secondary | ICD-10-CM

## 2018-02-22 DIAGNOSIS — O9989 Other specified diseases and conditions complicating pregnancy, childbirth and the puerperium: Secondary | ICD-10-CM

## 2018-02-22 DIAGNOSIS — Z880 Allergy status to penicillin: Secondary | ICD-10-CM | POA: Insufficient documentation

## 2018-02-22 DIAGNOSIS — Z833 Family history of diabetes mellitus: Secondary | ICD-10-CM | POA: Insufficient documentation

## 2018-02-22 DIAGNOSIS — O26892 Other specified pregnancy related conditions, second trimester: Principal | ICD-10-CM | POA: Insufficient documentation

## 2018-02-22 DIAGNOSIS — O328XX0 Maternal care for other malpresentation of fetus, not applicable or unspecified: Secondary | ICD-10-CM | POA: Insufficient documentation

## 2018-02-22 DIAGNOSIS — N806 Endometriosis in cutaneous scar: Secondary | ICD-10-CM | POA: Insufficient documentation

## 2018-02-22 LAB — CBC WITH DIFFERENTIAL/PLATELET
BASOS ABS: 0 10*3/uL (ref 0.0–0.1)
BASOS PCT: 0 %
EOS PCT: 2 %
Eosinophils Absolute: 0.2 10*3/uL (ref 0.0–0.7)
HEMATOCRIT: 30.8 % — AB (ref 36.0–46.0)
Hemoglobin: 10.8 g/dL — ABNORMAL LOW (ref 12.0–15.0)
Lymphocytes Relative: 12 %
Lymphs Abs: 0.9 10*3/uL (ref 0.7–4.0)
MCH: 32 pg (ref 26.0–34.0)
MCHC: 35.1 g/dL (ref 30.0–36.0)
MCV: 91.4 fL (ref 78.0–100.0)
MONO ABS: 0.1 10*3/uL (ref 0.1–1.0)
Monocytes Relative: 2 %
NEUTROS ABS: 6 10*3/uL (ref 1.7–7.7)
Neutrophils Relative %: 84 %
PLATELETS: 125 10*3/uL — AB (ref 150–400)
RBC: 3.37 MIL/uL — AB (ref 3.87–5.11)
RDW: 13.4 % (ref 11.5–15.5)
WBC: 7.1 10*3/uL (ref 4.0–10.5)

## 2018-02-22 LAB — URINALYSIS, ROUTINE W REFLEX MICROSCOPIC
BILIRUBIN URINE: NEGATIVE
GLUCOSE, UA: NEGATIVE mg/dL
HGB URINE DIPSTICK: NEGATIVE
KETONES UR: NEGATIVE mg/dL
NITRITE: NEGATIVE
PROTEIN: NEGATIVE mg/dL
Specific Gravity, Urine: 1.012 (ref 1.005–1.030)
pH: 6 (ref 5.0–8.0)

## 2018-02-22 LAB — COMPREHENSIVE METABOLIC PANEL
ALBUMIN: 3 g/dL — AB (ref 3.5–5.0)
ALK PHOS: 50 U/L (ref 38–126)
ALT: 14 U/L (ref 0–44)
ANION GAP: 7 (ref 5–15)
AST: 16 U/L (ref 15–41)
BILIRUBIN TOTAL: 0.5 mg/dL (ref 0.3–1.2)
BUN: 9 mg/dL (ref 6–20)
CALCIUM: 8.4 mg/dL — AB (ref 8.9–10.3)
CO2: 21 mmol/L — ABNORMAL LOW (ref 22–32)
Chloride: 107 mmol/L (ref 98–111)
Creatinine, Ser: 0.44 mg/dL (ref 0.44–1.00)
GFR calc Af Amer: >60 mL/min (ref >60)
Glucose, Bld: 78 mg/dL (ref 70–99)
Potassium: 3.8 mmol/L (ref 3.5–5.1)
Sodium: 135 mmol/L (ref 135–145)
TOTAL PROTEIN: 5.7 g/dL — AB (ref 6.5–8.1)

## 2018-02-22 LAB — POCT URINALYSIS DIP (DEVICE)
Bilirubin Urine: NEGATIVE
Glucose, UA: NEGATIVE mg/dL
Hgb urine dipstick: NEGATIVE
Ketones, ur: NEGATIVE mg/dL
NITRITE: NEGATIVE
Protein, ur: NEGATIVE mg/dL
Specific Gravity, Urine: 1.02 (ref 1.005–1.030)
Urobilinogen, UA: 0.2 mg/dL (ref 0.0–1.0)
pH: 6 (ref 5.0–8.0)

## 2018-02-22 MED ORDER — HYDROMORPHONE HCL 1 MG/ML IJ SOLN: 0.5000 mg | INTRAMUSCULAR | Status: DC | PRN

## 2018-02-22 MED ORDER — ONDANSETRON 4 MG PO TBDP: 4.0000 mg | ORAL_TABLET | Freq: Four times a day (QID) | ORAL | Status: DC | PRN

## 2018-02-22 MED ORDER — ONDANSETRON HCL 4 MG/2ML IJ SOLN: 4.0000 mg | Freq: Four times a day (QID) | INTRAMUSCULAR | Status: DC | PRN

## 2018-02-22 MED ORDER — DIPHENHYDRAMINE HCL 25 MG PO CAPS: 25.0000 mg | ORAL_CAPSULE | Freq: Four times a day (QID) | ORAL | Status: DC | PRN

## 2018-02-22 MED ORDER — KCL IN DEXTROSE-NACL 20-5-0.45 MEQ/L-%-% IV SOLN
INTRAVENOUS | Status: DC
  Administered 2018-02-23: 02:00:00 via INTRAVENOUS
  Filled 2018-02-22: qty 1000

## 2018-02-22 MED ORDER — ENOXAPARIN SODIUM 40 MG/0.4ML ~~LOC~~ SOLN
40.0000 mg | Freq: Every day | SUBCUTANEOUS | Status: DC
  Administered 2018-02-23: 40 mg via SUBCUTANEOUS
  Filled 2018-02-22 (×2): qty 0.4

## 2018-02-22 MED ORDER — ONDANSETRON HCL 4 MG/2ML IJ SOLN
4.0000 mg | Freq: Once | INTRAMUSCULAR | Status: AC
  Administered 2018-02-22: 4 mg via INTRAVENOUS
  Filled 2018-02-22: qty 2

## 2018-02-22 MED ORDER — HYDROMORPHONE HCL 1 MG/ML IJ SOLN
1.0000 mg | Freq: Once | INTRAMUSCULAR | Status: AC
  Administered 2018-02-22: 1 mg via INTRAVENOUS
  Filled 2018-02-22: qty 1

## 2018-02-22 MED ORDER — DIPHENHYDRAMINE HCL 50 MG/ML IJ SOLN: 25.0000 mg | Freq: Four times a day (QID) | INTRAMUSCULAR | Status: DC | PRN

## 2018-02-22 MED ORDER — PRENATAL MULTIVITAMIN CH
1.0000 | ORAL_TABLET | Freq: Every day | ORAL | Status: DC
Start: 2018-02-23 — End: 2018-02-24
  Administered 2018-02-23 – 2018-02-24 (×2): 1 via ORAL
  Filled 2018-02-22 (×3): qty 1

## 2018-02-22 MED ORDER — LACTATED RINGERS IV SOLN
INTRAVENOUS | Status: DC
  Administered 2018-02-22: 18:00:00 via INTRAVENOUS

## 2018-02-22 NOTE — MAU Note (Signed)
Pt reports pain on her right side that radiates around to her back. Denies bleeding.

## 2018-02-22 NOTE — Progress Notes (Signed)
PRENATAL VISIT NOTE  Subjective:  [name] is a 37 y.o. G3P0202 at [redacted]w[redacted]d being seen today for ongoing prenatal care.  She is currently monitored for the following issues for this high-risk pregnancy and has Endometriosis in scar of skin; Supervision of high risk pregnancy, antepartum; History of preterm delivery, currently pregnant; History of cesarean section, classical; AMA (advanced maternal age) multigravida 35+; and Marginal insertion of umbilical cord affecting management of mother on their problem list.  Patient reports severe abdominal pain on the right that started yesterday.  Has gotten worse since then, radiates to the back and occasionally to vagina. No anorexia, fever, nausea/vomiting, dysuria, diarrhea or vomiting.  Contractions: Not present. Vag. Bleeding: None.  Movement: Present. Denies leaking of fluid.   The following portions of the patient's history were reviewed and updated as appropriate: allergies, current medications, past family history, past medical history, past social history, past surgical history and problem list. Problem list updated.  Objective:   Vitals:   02/22/18 1339  BP: 104/63  Pulse: 83  Weight: 142 lb 11.2 oz (64.7 kg)    Fetal Status: Fetal Heart Rate (bpm): 140 Fundal Height: 20 cm Movement: Present     General:  Alert, oriented and cooperative. Patient is in no acute distress.  Skin: Skin is warm and dry. No rash noted.   Cardiovascular: Normal heart rate noted  Respiratory: Normal respiratory effort, no problems with respiration noted  Abdomen: Soft, gravid, appropriate for gestational age.  Pain/Pressure: Present     Pelvic: Cervical exam performed Dilation: Closed Effacement (%): Thick Station: Ballotable  Extremities: Normal range of motion.  Edema: None  Mental Status: Normal mood and affect. Normal behavior. Normal judgment and thought content.   Results for orders placed or performed in visit on 02/22/18 (from the past 24  hour(s))  POCT urinalysis dip (device)     Status: Abnormal   Collection Time: 02/22/18  1:53 PM  Result Value Ref Range   Glucose, UA NEGATIVE NEGATIVE mg/dL   Bilirubin Urine NEGATIVE NEGATIVE   Ketones, ur NEGATIVE NEGATIVE mg/dL   Specific Gravity, Urine 1.020 1.005 - 1.030   Hgb urine dipstick NEGATIVE NEGATIVE   pH 6.0 5.0 - 8.0   Protein, ur NEGATIVE NEGATIVE mg/dL   Urobilinogen, UA 0.2 0.0 - 1.0 mg/dL   Nitrite NEGATIVE NEGATIVE   Leukocytes, UA TRACE (A) NEGATIVE   [place] Mfm Ob Detail +14 Wk  Result Date: 02/15/2018 ----------------------------------------------------------------------  OBSTETRICS REPORT                      (Signed Final 02/15/2018 11:44 am) ---------------------------------------------------------------------- Patient Info  ID #:       [id number]                          D.O.B.:  [birth year] (36 yrs)  Name:       [name]            Visit Date: 02/15/2018 08:23 am ---------------------------------------------------------------------- Performed By  Performed By:     Earley Brooke     Ref. Address:      [facility]                                                              [address]                                                              [address]                                                              [place], [place]                                                              [phone]  Attending:        Noralee Spaceavi Shankar, MD       [facility]  [facility] By[facility]      [facility]                    [facility]                    [facility] ---------------------------------------------------------------------- Orders   #  Description                                 Code   1  US MFM OB DETAIL +14 WK                     [id number]  ----------------------------------------------------------------------   #   Ordered By               Order #        Accession #    Episode #   1  Steward DroneVERONICA ROGERS          [id number]      [phone]     [id number]  ---------------------------------------------------------------------- Indications   [redacted] weeks gestation of pregnancy                Z3A.20   Previous cesarean delivery X 2, antepartum     41O34.219   Advanced maternal age multigravida 2435+,        30O09.522   second trimester   Encounter for uncertain dates                  Z36.87   Poor obstetric history: Previous preterm       O09.219   delivery X 2 (28, 36 weeks CLASSICAL)   Encounter for  antenatal screening for          Z36.3   malformations   Marginal insertion of umbilical cord affecting O43.192   management of mother in second trimester  ---------------------------------------------------------------------- Fetal Evaluation  Num Of Fetuses:     1  Fetal Heart         149  Rate(bpm):  Cardiac Activity:   Observed  Presentation:       Cephalic  Placenta:           Posterior, above cervical os  P. Cord Insertion:  Marginal insertion  Amniotic Fluid  AFI FV:      Subjectively within normal limits                              Largest Pocket(cm)                              4.15 ---------------------------------------------------------------------- Biometry  BPD:      47.8  mm     G. Age:  20w 3d         38  %    CI:        73.75   %    70 - 86                                                          FL/HC:       18.7  %    15.9 - 20.3  HC:      176.8  mm     G. Age:  20w 1d         20  %    HC/AC:       1.03       1.06 - 1.25  AC:      171.5  mm     G. Age:  22w 1d         84  %    FL/BPD:      69.2  %  FL:       33.1  mm     G. Age:  20w 2d         29  %    FL/AC:       19.3  %    20 - 24  HUM:      31.8  mm     G. Age:  20w 4d         46  %  CER:      20.2  mm     G. Age:  19w 1d         16  %  NFT:       4.4  mm  CM:          6  mm  Est. FW:     404   gm    0 lb 14 oz     53  %  ---------------------------------------------------------------------- Gestational Age  U/S Today:     20w 5d  EDD:   06/30/18  Best:          Cherylann Parr 5d     Det. By:  U/S (02/15/18)           EDD:   06/30/18 ---------------------------------------------------------------------- Anatomy  Cranium:               Appears normal         LVOT:                   Appears normal  Cavum:                 Appears normal         Aortic Arch:            Not well visualized  Ventricles:            Appears normal         Ductal Arch:            Appears normal  Choroid Plexus:        Appears normal         Diaphragm:              Appears normal  Cerebellum:            Appears normal         Stomach:                Appears normal, left                                                                        sided  Posterior Fossa:       Appears normal         Abdomen:                Appears normal  Nuchal Fold:           Appears normal         Abdominal Wall:         Appears nml (cord                                                                        insert, abd wall)  Face:                  Appears normal         Cord Vessels:           Appears normal (3                         (orbits and profile)                           vessel cord)  Lips:                  Appears normal         Kidneys:  Appear normal  Palate:                Not well visualized    Bladder:                Appears normal  Thoracic:              Appears normal         Spine:                  Appears normal  Heart:                 Appears normal         Upper Extremities:      Visualized                         (4CH, axis, and situs  RVOT:                  Appears normal         Lower Extremities:      Appears normal  Other:  Fetus appears to be a female. Heels visualized. ---------------------------------------------------------------------- Cervix Uterus Adnexa  Cervix  Length:           3.11  cm.  Normal  appearance by transabdominal scan.  Left Ovary  Within normal limits.  Right Ovary  Within normal limits.  Adnexa:       No abnormality visualized. ---------------------------------------------------------------------- Impression  We performed fetal anatomy scan. No makers of  aneuploidies or fetal structural defects are seen. Fetal  biometry is consistent with her previously-established dates.  Amniotic fluid is normal and good fetal activity is seen.  Placenta is posterior and there is no evidence of previa  (previous 2 cesarean sections).  On transabdominal scan, the cervix looks long and closed.  Placental cord insertion is marginal. I counseled the patient  that marginal cord insertion can be associated with fetal  growth restriction in some cases. However, a majority of  pregnancies have good outcomes.  We recommend serial fetal growth assessments till delivery.  Patient has an appointment with our genetic counselor today.  You will be receiving a separate letter from her.  After genetic counseling, the patient opted not to have  screening or invasive testing for fetal karyotype. ---------------------------------------------------------------------- Recommendations  An appointment was made for her to return in 4 weeks for  fetal growth assessment. ----------------------------------------------------------------------                  Noralee Space, MD Electronically Signed Final Report   02/15/2018 11:44 am ----------------------------------------------------------------------   Assessment and Plan:  Pregnancy: O9G2952 at [redacted]w[redacted]d  1. Right sided abdominal pain Unsure etiology of her pain, ?extreme round ligament pain. But she is very tearful. May need limited imaging of her abdomen (evaluate adnexa/appendiz) to make sure nothing else is going on. Sent to [facility]U for further evaluation.    2. Antepartum multigravida of advanced maternal age Did not want genetic testing. Normal anatomy scan.  3. Supervision  of high risk pregnancy, antepartum Preterm labor symptoms and general obstetric precautions including but not limited to vaginal bleeding, contractions, leaking of fluid and fetal movement were reviewed in detail with the patient. Please refer to After Visit Summary for other counseling recommendations.  Return in about 1 month (around 03/22/2018) for OB Visit (HOB).  Future Appointments  Date Time Provider Department Center  03/14/2018  8:45 AM [facility] [place] 2 [facility]  Verita Schneiders, MD

## 2018-02-22 NOTE — Progress Notes (Signed)
C/o some lower back pain, abd pain. Started today. Patient teary. Report called to mau charge nurse by Dr. Macon LargeAnyanwu - and patient taken up to MAU.

## 2018-02-22 NOTE — Patient Instructions (Signed)
Return to clinic for any scheduled appointments or obstetric concerns, or go to MAU for evaluation  

## 2018-02-22 NOTE — H&P (Signed)
[name] is an 37 y.o. female.  History and exam taken with assistance of spanish interpreter.   Chief Complaint: abd pain HPI: [name] is a 37 y.o at 63w who presents from the [facility] office for abdominal pain. Symptoms began this morning.  Denies fever/chills, n/v/d, dysuria. Has hx of c/section.    Past Medical History:  Diagnosis Date  . Endometriosis in scar of skin   . PONV (postoperative nausea and vomiting)     Past Surgical History:  Procedure Laterality Date  . CESAREAN SECTION     X2  . LAPAROTOMY  11/15/2011   Procedure: EXPLORATORY LAPAROTOMY;  Surgeon: Scherry Ran, MD;  Location: [facility];  Service: General;  Laterality: N/A;  Exploration and Excision of Endometrial Implant  . MASS EXCISION N/A 04/09/2013   Procedure: EXCISION OF ENDOMETRIOMA;  Surgeon: Scherry Ran, MD;  Location: [facility];  Service: General;  Laterality: N/A;  . OVARIAN CYST REMOVAL  03/2016   3 surgeries to remove ovarian cysts    Family History  Problem Relation Age of Onset  . Diabetes Mother   . Cancer Mother        UTERINE   . Cancer Brother        LEUKEMIA  . Anesthesia problems Neg Hx   . Hypotension Neg Hx   . Malignant hyperthermia Neg Hx   . Pseudochol deficiency Neg Hx    Social History:  reports that she has never smoked. She has never used smokeless tobacco. She reports that she does not drink alcohol or use drugs.  Allergies:  Allergies  Allergen Reactions  . Penicillins Itching and Other (See Comments)    Causes dizziness and burning inside.  Has patient had a PCN reaction causing immediate rash, facial/tongue/throat swelling, SOB or lightheadedness with hypotension: No Has patient had a PCN reaction causing severe rash involving mucus membranes or skin necrosis: No Has patient had a PCN reaction that required hospitalization No Has patient had a PCN reaction occurring within the last 10 years: Yes If all of the above answers are "NO", then may  proceed with Cephalosporin use.   . Tylenol [Acetaminophen] Anxiety    Causes itching    Medications Prior to Admission  Medication Sig Dispense Refill  . fluticasone (FLONASE) 50 MCG/ACT nasal spray Place 1 spray into both nostrils daily. 16 g 2  . Prenatal Vit-Fe Fumarate-FA (PRENATAL MULTIVITAMIN) TABS tablet Take 1 tablet by mouth daily at 12 noon.    Water engineer Bandages & Supports (TRUFORM LITES MATERNITY HOSE) MISC Wear daily for support of lower leg varicose veins 1 each 0    Results for orders placed or performed during the hospital encounter of 02/22/18 (from the past 48 hour(s))  Urinalysis, Routine w reflex microscopic     Status: Abnormal   Collection Time: 02/22/18  2:35 PM  Result Value Ref Range   Color, Urine YELLOW YELLOW   APPearance CLEAR CLEAR   Specific Gravity, Urine 1.012 1.005 - 1.030   pH 6.0 5.0 - 8.0   Glucose, UA NEGATIVE NEGATIVE mg/dL   Hgb urine dipstick NEGATIVE NEGATIVE   Bilirubin Urine NEGATIVE NEGATIVE   Ketones, ur NEGATIVE NEGATIVE mg/dL   Protein, ur NEGATIVE NEGATIVE mg/dL   Nitrite NEGATIVE NEGATIVE   Leukocytes, UA TRACE (A) NEGATIVE   RBC / HPF 0-5 0 - 5 RBC/hpf   WBC, UA 0-5 0 - 5 WBC/hpf   Bacteria, UA RARE (A) NONE SEEN   Squamous Epithelial / LPF  0-5 0 - 5    Comment: Performed at [facility], [address], [place], [place] [address]  Comprehensive metabolic panel     Status: Abnormal   Collection Time: 02/22/18  3:48 PM  Result Value Ref Range   Sodium 135 135 - 145 mmol/L   Potassium 3.8 3.5 - 5.1 mmol/L   Chloride 107 98 - 111 mmol/L    Comment: Please note change in reference range.   CO2 21 (L) 22 - 32 mmol/L   Glucose, Bld 78 70 - 99 mg/dL    Comment: Please note change in reference range.   BUN 9 6 - 20 mg/dL    Comment: Please note change in reference range.   Creatinine, Ser 0.44 0.44 - 1.00 mg/dL   Calcium 8.4 (L) 8.9 - 10.3 mg/dL   Total Protein 5.7 (L) 6.5 - 8.1 g/dL   Albumin 3.0 (L) 3.5 - 5.0 g/dL    AST 16 15 - 41 U/L   ALT 14 0 - 44 U/L    Comment: Please note change in reference range.   Alkaline Phosphatase 50 38 - 126 U/L   Total Bilirubin 0.5 0.3 - 1.2 mg/dL   GFR calc non Af Amer >60 >60 mL/min   GFR calc Af Amer >60 >60 mL/min    Comment: (NOTE) The eGFR has been calculated using the CKD EPI equation. This calculation has not been validated in all clinical situations. eGFR's persistently <60 mL/min signify possible Chronic Kidney Disease.    Anion gap 7 5 - 15    Comment: Performed at [facility], [address] [place], [place] [address]  CBC with Differential/Platelet     Status: Abnormal   Collection Time: 02/22/18  3:48 PM  Result Value Ref Range   WBC 7.1 4.0 - 10.5 K/uL   RBC 3.37 (L) 3.87 - 5.11 MIL/uL   Hemoglobin 10.8 (L) 12.0 - 15.0 g/dL   HCT 30.8 (L) 36.0 - 46.0 %   MCV 91.4 78.0 - 100.0 fL   MCH 32.0 26.0 - 34.0 pg   MCHC 35.1 30.0 - 36.0 g/dL   RDW 13.4 11.5 - 15.5 %   Platelets 125 (L) 150 - 400 K/uL   Neutrophils Relative % 84 %   Neutro Abs 6.0 1.7 - 7.7 K/uL   Lymphocytes Relative 12 %   Lymphs Abs 0.9 0.7 - 4.0 K/uL   Monocytes Relative 2 %   Monocytes Absolute 0.1 0.1 - 1.0 K/uL   Eosinophils Relative 2 %   Eosinophils Absolute 0.2 0.0 - 0.7 K/uL   Basophils Relative 0 %   Basophils Absolute 0.0 0.0 - 0.1 K/uL    Comment: Performed at [facility], [address] [place], [place] [address]   [place] Mfm Ob Transvaginal  Result Date: 02/22/2018 ----------------------------------------------------------------------  OBSTETRICS REPORT                      (Signed Final 02/22/2018 05:14 pm) ---------------------------------------------------------------------- Patient Info  ID #:       [id number]                          D.O.B.:  25-Jul-1981 (37 yrs)  Name:       [name]            Visit Date: 02/22/2018 04:24 pm ---------------------------------------------------------------------- Performed By  Performed By:     Rodrigo Ran BS       Ref.  Address:     [facility]                                                             [facility]                                                             [place], [place]  Attending:        Tama High MD        Location:         [facility]  Referred By:      [facility] for                    [place] ---------------------------------------------------------------------- Orders   #  Description                                 Code   1  [place] MFM [facility] TRANSVAGINAL                      41287.8   2  [place] MFM OB LIMITED                           67672.09  ----------------------------------------------------------------------   #  Ordered By               Order #        Accession #    Episode #   1  Jorje Guild            [id number]  [id number]     [id number]   [place]            [id number]      [id number]     [id number]  ---------------------------------------------------------------------- Indications   [redacted] weeks gestation of pregnancy                Z3A.21   Pelvic pain affecting pregnancy in second      O26.892   trimester   Previous cesarean delivery X 2, antepartum     O46.219   Advanced maternal age multigravida 71+,        O26.522   second trimester (declined further testing)   Poor obstetric history: Previous preterm       O09.219   delivery X 2 (28, 36 weeks CLASSICAL)   Marginal insertion of umbilical cord affecting O43.192   management of mother in second trimester  ---------------------------------------------------------------------- Fetal Evaluation  Num Of Fetuses:     1  Fetal Heart         140  Rate(bpm):  Cardiac Activity:   Observed  Presentation:       Transverse, head to maternal  right  Placenta:           Posterior, above cervical os  P. Cord Insertion:  Marginal insertion  Amniotic Fluid  AFI FV:      Subjectively within normal limits                              Largest Pocket(cm)                              5.3 ---------------------------------------------------------------------- Gestational Age  Best:          21w 5d     Det. By:  U/S  (02/15/18)          EDD:   06/30/18 ---------------------------------------------------------------------- Cervix Uterus Adnexa  Cervix  Length:              4  cm.  Normal appearance by transvaginal scan  Uterus  No abnormality visualized.  Cul De Sac:   No free fluid seen.  Adnexa:       No abnormality visualized. ---------------------------------------------------------------------- Impression  A limited ultrasound was performed to evaluate the cervix  (c/o) pelvic pain.  Amniotic fluid is normal and good fetal activity is seen. We  performed transvaginal ultrasound and the cervix measures 4  cm, which is normal. No evidence of placenta previa or  accreta.  Patient had 2 classical cesarean sections. ----------------------------------------------------------------------                  Tama High, MD Electronically Signed Final Report   02/22/2018 05:14 pm ----------------------------------------------------------------------  [place] Mfm Ob Limited  Result Date: 02/22/2018 ----------------------------------------------------------------------  OBSTETRICS REPORT                      (Signed Final 02/22/2018 05:14 pm) ---------------------------------------------------------------------- Patient Info  ID #:       [id number]                          D.O.B.:  20-Aug-1980 (37 yrs)  Name:       [name]            Visit Date: 02/22/2018 04:24 pm ---------------------------------------------------------------------- Performed By  Performed By:  Carrie Stalter BS      Ref. Address:     [facility]                                                              [facility]                                                             [place], [place]  Attending:        Tama High MD        Location:         [facility]  Referred By:      [facility] for                    [place] ---------------------------------------------------------------------- Orders   #  Description                                 Code   1  [place] MFM [facility] TRANSVAGINAL                      95638.7   2  [place] MFM OB LIMITED                           56433.29  ----------------------------------------------------------------------   #  Ordered By               Order #        Accession #    Episode #   [place]  [id number]      [id number]     [id number]   2  ERIN LAWRENCE            [id number]      [id number]     [id number]  ---------------------------------------------------------------------- Indications   [redacted] weeks gestation of pregnancy                Z3A.21   Pelvic pain affecting pregnancy in second      O26.892   trimester   Previous cesarean delivery X 2, antepartum     O42.219   Advanced maternal age multigravida 74+,        O75.522   second trimester (declined further testing)   Poor obstetric history: Previous preterm       O09.219   delivery X 2 (28, 36 weeks CLASSICAL)   Marginal insertion of umbilical cord affecting O43.192   management of mother in second trimester  ---------------------------------------------------------------------- Fetal Evaluation  Num Of Fetuses:     1  Fetal Heart         140  Rate(bpm):  Cardiac Activity:   Observed  Presentation:       Transverse, head to maternal right  Placenta:           Posterior, above cervical os  P. Cord Insertion:  Marginal  insertion  Amniotic Fluid  AFI FV:      Subjectively within normal limits                              Largest Pocket(cm)                              5.3 ---------------------------------------------------------------------- Gestational Age  Best:          21w 5d     Det. By:  U/S  (02/15/18)          EDD:   06/30/18 ---------------------------------------------------------------------- Cervix Uterus Adnexa  Cervix  Length:              4  cm.  Normal appearance by transvaginal scan  Uterus  No abnormality visualized.  Cul De Sac:   No free fluid seen.  Adnexa:       No abnormality visualized. ---------------------------------------------------------------------- Impression  A limited ultrasound was performed to evaluate the cervix  (c/o) pelvic pain.  Amniotic fluid is normal and good fetal activity is seen. We  performed transvaginal ultrasound and the cervix measures 4  cm, which is normal. No evidence of placenta previa or  accreta.  Patient had 2 classical cesarean sections. ----------------------------------------------------------------------                  Tama High, MD Electronically Signed Final Report   02/22/2018 05:14 pm ----------------------------------------------------------------------   Review of Systems  Constitutional: Negative for chills and fever.  HENT: Negative for hearing loss.   Eyes: Negative for blurred vision and double vision.  Respiratory: Negative for cough and shortness of breath.   Cardiovascular: Negative for chest pain and palpitations.  Gastrointestinal: Positive for abdominal pain and nausea. Negative for constipation and diarrhea.  Genitourinary: Negative for dysuria and urgency.  Skin: Negative for itching and rash.  Neurological: Negative for dizziness and headaches.    Blood pressure 98/64, pulse 76, temperature 98.7 F (37.1 C), resp. rate 18, height 5' 4.5" (1.638 m), weight 64.9 kg (143 lb), last menstrual period 10/03/2017, SpO2  100 %. Physical  Exam  Constitutional: She is oriented to person, place, and time. She appears well-developed and well-nourished.  HENT:  Head: Normocephalic and atraumatic.  Eyes: Pupils are equal, round, and reactive to light. Conjunctivae and EOM are normal.  Neck: Normal range of motion. Neck supple.  Cardiovascular: Normal rate and regular rhythm.  Respiratory: Effort normal. No respiratory distress.  GI: Soft. There is tenderness. There is no rebound and no guarding.  Reducible incisional hernia, suprapubic  Musculoskeletal: Normal range of motion.  Neurological: She is alert and oriented to person, place, and time.     Assessment/Plan 37 y.o. pregnant F with reducible but painful incisional hernia.  I have asked for her to be transferred to the floor at [facility]  Most likely will need surgical repair given her pain.  NPO, IVF's.  No further fetal monitoring needed per Dr Glo Herring as pt is below viability.    Rosario Adie., MD 8/47/8412, 11:06 PM

## 2018-02-22 NOTE — Progress Notes (Signed)
Toco applied 

## 2018-02-22 NOTE — MAU Provider Note (Addendum)
History     CSN: [id number]  Arrival date and time: 02/22/18 1416   First Provider Initiated Contact with Patient 02/22/18 1518      Chief Complaint  Patient presents with  . Abdominal Pain  . Back Pain   HPI  [name] is a 37 y.o. [id number] at [redacted]w[redacted]d who presents from the office for abdominal pain. Symptoms began this morning. Reports mid right abdominal pain that is constant but has worsening pain that comes in waves. Rates pain 6/10. Pain is worse when she stands or touches her abdomen. Has not treated pain. Pain radiates to her back (same spot as her abdomen, just on her back). Denies fever/chills, n/v/d, dysuria, vaginal bleeding, LOF, or recent intercourse. Has hx of 2 PTD & a classical c/section. Cervical exam in office today by Dr. Macon Large was closed.   Spanish interpreter, Viria, at bedside for interview and exam.   OB History    Gravida  3   Para  2   Term  0   Preterm  2   AB  0   Living  2     SAB  0   TAB  0   Ectopic  0   Multiple  0   Live Births  2           Past Medical History:  Diagnosis Date  . Endometriosis in scar of skin   . PONV (postoperative nausea and vomiting)     Past Surgical History:  Procedure Laterality Date  . CESAREAN SECTION     X2  . LAPAROTOMY  11/15/2011   Procedure: EXPLORATORY LAPAROTOMY;  Surgeon: Marlane Hatcher, MD;  Location: [facility];  Service: General;  Laterality: N/A;  Exploration and Excision of Endometrial Implant  . MASS EXCISION N/A 04/09/2013   Procedure: EXCISION OF ENDOMETRIOMA;  Surgeon: Marlane Hatcher, MD;  Location: [facility];  Service: General;  Laterality: N/A;  . OVARIAN CYST REMOVAL  03/2016   3 surgeries to remove ovarian cysts    Family History  Problem Relation Age of Onset  . Diabetes Mother   . Cancer Mother        UTERINE   . Cancer Brother        LEUKEMIA  . Anesthesia problems Neg Hx   . Hypotension Neg Hx   . Malignant hyperthermia Neg Hx   . Pseudochol  deficiency Neg Hx     Social History   Tobacco Use  . Smoking status: Never Smoker  . Smokeless tobacco: Never Used  Substance Use Topics  . Alcohol use: No    Alcohol/week: 0.0 oz  . Drug use: No    Allergies:  Allergies  Allergen Reactions  . Penicillins Itching and Other (See Comments)    Causes dizziness and burning inside.  Has patient had a PCN reaction causing immediate rash, facial/tongue/throat swelling, SOB or lightheadedness with hypotension: No Has patient had a PCN reaction causing severe rash involving mucus membranes or skin necrosis: No Has patient had a PCN reaction that required hospitalization No Has patient had a PCN reaction occurring within the last 10 years: Yes If all of the above answers are "NO", then may proceed with Cephalosporin use.   . Tylenol [Acetaminophen] Anxiety    Causes itching    Medications Prior to Admission  Medication Sig Dispense Refill Last Dose  . fluticasone (FLONASE) 50 MCG/ACT nasal spray Place 1 spray into both nostrils daily. 16 g 2 Past Week at Unknown time  .  Prenatal Vit-Fe Fumarate-FA (PRENATAL MULTIVITAMIN) TABS tablet Take 1 tablet by mouth daily at 12 noon.   02/21/2018 at Unknown time  . Elastic Bandages & Supports (TRUFORM LITES MATERNITY HOSE) MISC Wear daily for support of lower leg varicose veins 1 each 0 Taking    Review of Systems  Constitutional: Negative.   Gastrointestinal: Positive for abdominal pain. Negative for diarrhea, nausea and vomiting.  Genitourinary: Negative.   Musculoskeletal: Positive for back pain.   Physical Exam   Blood pressure 98/64, pulse 76, temperature 98.7 F (37.1 C), resp. rate 18, height 5' 4.5" (1.638 m), weight 143 lb (64.9 kg), last menstrual period 10/03/2017, SpO2 100 %.  Physical Exam  Nursing note and vitals reviewed. Constitutional: She is oriented to person, place, and time. She appears well-developed and well-nourished. No distress.  HENT:  Head: Normocephalic and  atraumatic.  Eyes: Conjunctivae are normal. Right eye exhibits no discharge. Left eye exhibits no discharge. No scleral icterus.  Neck: Normal range of motion.  Cardiovascular: Normal rate, regular rhythm and normal heart sounds.  No murmur heard. Respiratory: Effort normal and breath sounds normal. No respiratory distress. She has no wheezes.  GI: Soft. Bowel sounds are normal. There is tenderness in the periumbilical area. There is no rigidity, no rebound and no guarding.    Neurological: She is alert and oriented to person, place, and time.  Skin: Skin is warm and dry. She is not diaphoretic.  Psychiatric: She has a normal mood and affect. Her behavior is normal. Judgment and thought content normal.    MAU Course  Procedures Results for orders placed or performed during the hospital encounter of 02/22/18 (from the past 24 hour(s))  Urinalysis, Routine w reflex microscopic     Status: Abnormal   Collection Time: 02/22/18  2:35 PM  Result Value Ref Range   Color, Urine YELLOW YELLOW   APPearance CLEAR CLEAR   Specific Gravity, Urine 1.012 1.005 - 1.030   pH 6.0 5.0 - 8.0   Glucose, UA NEGATIVE NEGATIVE mg/dL   Hgb urine dipstick NEGATIVE NEGATIVE   Bilirubin Urine NEGATIVE NEGATIVE   Ketones, ur NEGATIVE NEGATIVE mg/dL   Protein, ur NEGATIVE NEGATIVE mg/dL   Nitrite NEGATIVE NEGATIVE   Leukocytes, UA TRACE (A) NEGATIVE   RBC / HPF 0-5 0 - 5 RBC/hpf   WBC, UA 0-5 0 - 5 WBC/hpf   Bacteria, UA RARE (A) NONE SEEN   Squamous Epithelial / LPF 0-5 0 - 5  Comprehensive metabolic panel     Status: Abnormal   Collection Time: 02/22/18  3:48 PM  Result Value Ref Range   Sodium 135 135 - 145 mmol/L   Potassium 3.8 3.5 - 5.1 mmol/L   Chloride 107 98 - 111 mmol/L   CO2 21 (L) 22 - 32 mmol/L   Glucose, Bld 78 70 - 99 mg/dL   BUN 9 6 - 20 mg/dL   Creatinine, Ser 5.28 0.44 - 1.00 mg/dL   Calcium 8.4 (L) 8.9 - 10.3 mg/dL   Total Protein 5.7 (L) 6.5 - 8.1 g/dL   Albumin 3.0 (L) 3.5 - 5.0  g/dL   AST 16 15 - 41 U/L   ALT 14 0 - 44 U/L   Alkaline Phosphatase 50 38 - 126 U/L   Total Bilirubin 0.5 0.3 - 1.2 mg/dL   GFR calc non Af Amer >60 >60 mL/min   GFR calc Af Amer >60 >60 mL/min   Anion gap 7 5 - 15  CBC with Differential/Platelet  Status: Abnormal   Collection Time: 02/22/18  3:48 PM  Result Value Ref Range   WBC 7.1 4.0 - 10.5 K/uL   RBC 3.37 (L) 3.87 - 5.11 MIL/uL   Hemoglobin 10.8 (L) 12.0 - 15.0 g/dL   HCT 16.1 (L) 09.6 - 04.5 %   MCV 91.4 78.0 - 100.0 fL   MCH 32.0 26.0 - 34.0 pg   MCHC 35.1 30.0 - 36.0 g/dL   RDW 40.9 81.1 - 91.4 %   Platelets 125 (L) 150 - 400 K/uL   Neutrophils Relative % 84 %   Neutro Abs 6.0 1.7 - 7.7 K/uL   Lymphocytes Relative 12 %   Lymphs Abs 0.9 0.7 - 4.0 K/uL   Monocytes Relative 2 %   Monocytes Absolute 0.1 0.1 - 1.0 K/uL   Eosinophils Relative 2 %   Eosinophils Absolute 0.2 0.0 - 0.7 K/uL   Basophils Relative 0 %   Basophils Absolute 0.0 0.0 - 0.1 K/uL   [place] Mfm Ob Transvaginal  Result Date: 02/22/2018 ----------------------------------------------------------------------  OBSTETRICS REPORT                      (Signed Final 02/22/2018 05:14 pm) ---------------------------------------------------------------------- Patient Info  ID #:       [id number]                          D.O.B.:  [birth year] (37 yrs)  Name:       Mary Snow            Visit Date: 02/22/2018 04:24 pm ---------------------------------------------------------------------- Performed By  Performed By:     Eden Lathe BS      Ref. Address:     [facility]                    RDMS RVT                                                             OB/Gyn Clinic                                                             [address]                                                             [place], [place]  [address]  Attending:         Noralee Space MD        Location:         [facility]  Referred By:      [facility] for                    [facility]                    Healthcare ---------------------------------------------------------------------- Orders   #  Description                                 Code   1  [place] MFM OB TRANSVAGINAL                      60454.0   2  [place] MFM OB LIMITED                           98119.14  ----------------------------------------------------------------------   #  Ordered By               Order #        Accession #    Episode #   1  Judeth Horn            [id number]      [id number]     [id number]   2  Judeth Horn            [id number]      [id number]     [id number]  ---------------------------------------------------------------------- Indications   [redacted] weeks gestation of pregnancy                Z3A.21   Pelvic pain affecting pregnancy in second      O26.892   trimester   Previous cesarean delivery X 2, antepartum     O55.219   Advanced maternal age multigravida 48+,        O2.522   second trimester (declined further testing)   Poor obstetric history: Previous preterm       O09.219   delivery X 2 (28, 36 weeks CLASSICAL)   Marginal insertion of umbilical cord affecting O43.192   management of mother in second trimester  ---------------------------------------------------------------------- Fetal Evaluation  Num Of Fetuses:     1  Fetal Heart         140  Rate(bpm):  Cardiac Activity:   Observed  Presentation:       Transverse, head to maternal right  Placenta:           Posterior, above cervical os  P. Cord Insertion:  Marginal insertion  Amniotic Fluid  AFI FV:      Subjectively within normal limits                              Largest Pocket(cm)                              5.3 ---------------------------------------------------------------------- Gestational Age  Best:          21w 5d     Det. By:  U/S  (02/15/18)  EDD:   06/30/18  ---------------------------------------------------------------------- Cervix Uterus Adnexa  Cervix  Length:              4  cm.  Normal appearance by transvaginal scan  Uterus  No abnormality visualized.  Cul De Sac:   No free fluid seen.  Adnexa:       No abnormality visualized. ---------------------------------------------------------------------- Impression  A limited ultrasound was performed to evaluate the cervix  (c/o) pelvic pain.  Amniotic fluid is normal and good fetal activity is seen. We  performed transvaginal ultrasound and the cervix measures 4  cm, which is normal. No evidence of placenta previa or  accreta.  Patient had 2 classical cesarean sections. ----------------------------------------------------------------------                  Noralee Spaceavi Shankar, MD Electronically Signed Final Report   02/22/2018 05:14 pm ----------------------------------------------------------------------  [place] Mfm Ob Limited  Result Date: 02/22/2018 ----------------------------------------------------------------------  OBSTETRICS REPORT                      (Signed Final 02/22/2018 05:14 pm) ---------------------------------------------------------------------- Patient Info  ID #:       [id number]                          D.O.B.:  07/17/1981 (37 yrs)  Name:       [name]            Visit Date: 02/22/2018 04:24 pm ---------------------------------------------------------------------- Performed By  Performed By:     Eden Lathearrie Stalter BS      Ref. Address:     [facility]                    RDMS RVT                                                             OB/Gyn Clinic                                                             [address]                                                             [address]                                                             [place], [place]  [address]  Attending:        Noralee Space MD        Location:          [facility]  Referred By:      [facility] for                    [facility]                    Healthcare ---------------------------------------------------------------------- Orders   #  Description                                 Code   1  [place] MFM OB TRANSVAGINAL                      69629.5   2  [place] MFM OB LIMITED                           28413.24  ----------------------------------------------------------------------   #  Ordered By               Order #        Accession #    Episode #   1  Judeth Horn            [id number]      [id number]     [id number]   2  Judeth Horn            [id number]      [id number]     [id number]  ---------------------------------------------------------------------- Indications   [redacted] weeks gestation of pregnancy                Z3A.21   Pelvic pain affecting pregnancy in second      O26.892   trimester   Previous cesarean delivery X 2, antepartum     O52.219   Advanced maternal age multigravida 87+,        O56.522   second trimester (declined further testing)   Poor obstetric history: Previous preterm       O09.219   delivery X 2 (28, 36 weeks CLASSICAL)   Marginal insertion of umbilical cord affecting O43.192   management of mother in second trimester  ---------------------------------------------------------------------- Fetal Evaluation  Num Of Fetuses:     1  Fetal Heart         140  Rate(bpm):  Cardiac Activity:   Observed  Presentation:       Transverse, head to maternal right  Placenta:           Posterior, above cervical os  P. Cord Insertion:  Marginal insertion  Amniotic Fluid  AFI FV:      Subjectively within normal limits                              Largest Pocket(cm)                              5.3 ---------------------------------------------------------------------- Gestational Age  Best:          21w 5d     Det. By:  U/S  (02/15/18)  EDD:   06/30/18 ---------------------------------------------------------------------- Cervix  Uterus Adnexa  Cervix  Length:              4  cm.  Normal appearance by transvaginal scan  Uterus  No abnormality visualized.  Cul De Sac:   No free fluid seen.  Adnexa:       No abnormality visualized. ---------------------------------------------------------------------- Impression  A limited ultrasound was performed to evaluate the cervix  (c/o) pelvic pain.  Amniotic fluid is normal and good fetal activity is seen. We  performed transvaginal ultrasound and the cervix measures 4  cm, which is normal. No evidence of placenta previa or  accreta.  Patient had 2 classical cesarean sections. ----------------------------------------------------------------------                  Noralee Space, MD Electronically Signed Final Report   02/22/2018 05:14 pm ----------------------------------------------------------------------   MDM FHT 148 by doppler VSS, pt afebrile  Ob ultrasound --- no placental abnormality & cervical length 4 cm Ctx noted on toco CBC - WBC normal, pt afebrile, & no rebound tenderness Called Dr. Emelda Fear to evaluate patient.  Patient sent for MRI. Results pending  Dr. Emelda Fear to assume care of patient.  Judeth Horn, NP 02/22/2018 10:22 PM   Patient examined at approx  6 pm, exam highly suspicious for ventral hernia/incisional hernia, with bowel contents.  Arrangements made for MRI to further delineate the abnormality. Pt sent to [facility] for MRI.  10:32 MRI reviewed, non incarcerated hernia containing small bowel confirmed.  Gen surgery consulted, and acceptance in transfer arranged. Dr Maisie Fus to come to see pt here, and pt to be transferred to [facility]. Assessment and Plan  Non -incarcerated incisional hernia, symptomatic. Plan: NPO Transfer to [facility] for continued care, will likely need repair.  Fetus stable by our eval here, will ask Rapid Response RN to document FHR before and after any surgery.  NOTE Rapid Response RN carries phone [phone].

## 2018-02-23 DIAGNOSIS — K439 Ventral hernia without obstruction or gangrene: Secondary | ICD-10-CM | POA: Diagnosis present

## 2018-02-23 MED ORDER — HYDROMORPHONE HCL 1 MG/ML IJ SOLN: 0.5000 mg | INTRAMUSCULAR | Status: DC | PRN

## 2018-02-23 MED ORDER — IBUPROFEN 200 MG PO TABS: 200.0000 mg | ORAL_TABLET | Freq: Four times a day (QID) | ORAL | Status: DC | PRN

## 2018-02-23 MED ORDER — TRAMADOL HCL 50 MG PO TABS: 50.0000 mg | ORAL_TABLET | Freq: Three times a day (TID) | ORAL | Status: DC | PRN

## 2018-02-23 NOTE — Progress Notes (Addendum)
    CC: abdominal pain  Subjective: Pt [redacted] weeks pregnant who has a hx of endometriosis.  It seems her endometriosis gets worse with her pregnancies.  This is her third.  Her pain is currently with standing and eating.  Pt does not speak much English but her husband is at the bedside and speaks English so he is translating for her.    Objective: Vital signs in last 24 hours: Temp:  [97.4 F (36.3 C)-98.7 F (37.1 C)] 97.4 F (36.3 C) (07/11 0539) Pulse Rate:  [70-83] 70 (07/11 0910) Resp:  [15-18] 15 (07/11 0910) BP: (95-104)/(46-64) 101/64 (07/11 0910) SpO2:  [100 %] 100 % (07/11 0910) Weight:  [64.7 kg (142 lb 11.2 oz)-64.9 kg (143 lb)] 64.9 kg (143 lb) (07/10 1434)  NPO Voided x 1 Stool - 0 Afebrile, VSS Labs OK MRI:  Pelvic ventral hernia containing loops of small bowel without evidence of obstruction or specific complicating feature. The hernia site is approximately at the expected location of a low transverse incision site.  Single intrauterine pregnancy observed without specific complicating feature identified. Trace amount of free pelvic fluid in the left lower quadrant near the left ovary. Intake/Output from previous day: No intake/output data recorded. Intake/Output this shift: No intake/output data recorded.  General appearance: alert, cooperative and no distress Resp: clear to auscultation bilaterally GI: soft, non-tender; bowel sounds normal; no masses,  no organomegaly and ventral hernia is stable, no incarceration.  soft with BS.  Lab Results:  Recent Labs    02/22/18 1548  WBC 7.1  HGB 10.8*  HCT 30.8*  PLT 125*    BMET Recent Labs    02/22/18 1548  NA 135  K 3.8  CL 107  CO2 21*  GLUCOSE 78  BUN 9  CREATININE 0.44  CALCIUM 8.4*   PT/INR No results for input(s): LABPROT, INR in the last 72 hours.  Recent Labs  Lab 02/22/18 1548  AST 16  ALT 14  ALKPHOS 50  BILITOT 0.5  PROT 5.7*  ALBUMIN 3.0*     Lipase     Component Value  Date/Time   LIPASE 23 03/26/2016 0545     Medications: . enoxaparin (LOVENOX) injection  40 mg Subcutaneous Daily  . prenatal multivitamin  1 tablet Oral Q1200   . dextrose 5 % and 0.45 % NaCl with KCl 20 mEq/L 75 mL/hr at 02/23/18 0202  . lactated ringers 125 mL/hr at 02/22/18 1827    Assessment/Plan   Abdominal Incisional hernia with pain [redacted] weeks gestation Hx of endometriosis Hx of PONV  FEN:  IV fluids/NPO ID:  None DVT:  lovenox Follow up:  OB/Dr. Daphine DeutscherMartin  Plan:  Dr. Daphine DeutscherMartin reviewed studies and if she is not obstructed or incarcerated he would not try to do ventral hernia repair now.  We will try a binder, some Ibuprofen and Tramadol for pain control.  Pain is worse with pregnancies, and endometriosis in the past.          LOS: 0 days    Florette Thai 02/23/2018 [phone]

## 2018-02-24 MED ORDER — TRAMADOL HCL 50 MG PO TABS: 50.0000 mg | ORAL_TABLET | Freq: Two times a day (BID) | ORAL | 0 refills | Status: DC | PRN

## 2018-02-24 NOTE — Progress Notes (Signed)
Discharge instructions reviewed with patient using iPad translator. All questions answered. Patient ambulated to vehicle with belongings by nurse tech

## 2018-02-24 NOTE — Discharge Instructions (Signed)
Hernia - Adultos (Hernia, Adult) Una hernia ocurre cuando un rgano o un tejido interno se protruye a travs de un punto debilitado del vientre (abdomen). CUIDADOS EN EL HOGAR  No estire ni use en exceso (sobrecargue) los msculos que estn cerca de la hernia.  No levante ningn objeto que pese ms de 10libras (4,5kg).  Para levantar objetos, use los msculos de las piernas. No use los msculos de la espalda.  Cuando tosa, hgalo con suavidad.  Consuma una dieta con alto contenido de [place] Pointfibra. Coma gran cantidad de frutas y verduras.  Beba suficiente lquido para mantener el pis (orina) claro o de color amarillo plido. Trate de beber 6 u 8vasos de Warehouse manageragua por da.  Tome medicamentos para ablandar la materia fecal (ablandadores de heces) como se lo haya indicado el mdico.  Baje de Shaftpeso, si tiene sobrepeso.  No consuma ningn producto que contenga tabaco, lo que incluye cigarrillos, tabaco de Theatre managermascar o Administrator, Civil Servicecigarrillos electrnicos. Si necesita ayuda para dejar de fumar, consulte al mdico.  Concurra a todas las visitas de control como se lo haya indicado el mdico. Esto es importante. SOLICITE AYUDA SI:  La piel que rodea la hernia se inflama (hincha) o se enrojece.  Le duele la hernia. SOLICITE AYUDA DE INMEDIATO SI:  Tiene fiebre.  Siente dolor abdominal que empeora.  Tiene malestar estomacal (nuseas) o vomita.  No puede volver a Electrical engineercolocar la hernia en su lugar al ejercer sobre esta una presin suave mientras est acostado.  La hernia: ? Cambia de forma o de tamao. ? Se le atasca fuera del vientre. ? Cambia de color. ? Est dura al tacto o le causa dolor a la palpacin. Esta informacin no tiene Theme park managercomo fin reemplazar el consejo del mdico. Asegrese de hacerle al mdico cualquier pregunta que tenga. Document Released: 05/23/2013 Document Revised: 08/23/2014 Document Reviewed: 06/12/2014 Elsevier Interactive Patient Education  2018 [employer]

## 2018-02-24 NOTE — Discharge Summary (Signed)
[facility] Discharge Summary   Patient ID: [name] MRN: [id number] DOB/AGE: 1980/11/07 37 y.o.  Admit date: 02/22/2018 Discharge date: 02/24/2018  Admitting Diagnosis: Incisional hernia 20 weeks gravid  Discharge Diagnosis Patient Active Problem List   Diagnosis Date Noted  . Ventral hernia 02/23/2018  . Incisional hernia without obstruction or gangrene 02/22/2018  . Marginal insertion of umbilical cord affecting management of mother 02/15/2018  . Supervision of high risk pregnancy, antepartum 12/26/2017  . History of preterm delivery, currently pregnant 12/26/2017  . History of cesarean section, classical 12/26/2017  . AMA (advanced maternal age) multigravida 35+ 12/26/2017  . Endometriosis in scar of skin 10/25/2013    Consultants OB/GYN  Imaging: Mr Pelvis Wo Contrast  Result Date: 02/23/2018 CLINICAL DATA:  Second trimester pregnancy. Ventral hernia, prior cesarean section. EXAM: MRI ABDOMEN AND PELVIS WITHOUT CONTRAST TECHNIQUE: Multiplanar multisequence MR imaging of the abdomen and pelvis was performed. No intravenous contrast was administered. COMPARISON:  Obstetric ultrasound of 02/22/2018; CT abdomen from 01/14/2016 FINDINGS: Despite efforts by the technologist and patient, motion artifact is present on today's exam and could not be eliminated. This reduces exam sensitivity and specificity. COMBINED FINDINGS FOR BOTH MR ABDOMEN AND PELVIS Lower chest: Unremarkable where included. Hepatobiliary: There are 2 T2 signal hyperintensities each about 5 mm in diameter in the left hepatic lobe, images 17 and 19 of series 5, probably small cysts or similar benign lesions. Gallbladder unremarkable. Pancreas:  Unremarkable Spleen:  Unremarkable Adrenals/Urinary Tract:  Unremarkable Stomach/Bowel: Pelvic anterior abdominal wall hernia corresponding roughly to the expected location of a prior low-transverse incision, containing loops of small bowel which have  internal air-fluid levels but which are not overtly dilated. The hernia sac measures approximately 13.9 by 4.6 by 10.7 cm, with the neck of the hernia measuring 9.7 by 6.4 cm. There is no evidence of obstruction. Vascular/Lymphatic: Circumaortic left renal vein. Otherwise unremarkable. Reproductive: Single intrauterine pregnancy observed with posterior placenta, no complicating feature identified. Today's exam was not performed for assessment of fetal anatomy. The cervix is long and closed. The amount of amniotic fluid appears appropriate. Transverse lie with footling breech noted. No previa. Other: Trace free fluid in the left lower quadrant adjacent to the left ovary. Musculoskeletal: Unremarkable IMPRESSION: 1. Pelvic ventral hernia containing loops of small bowel without evidence of obstruction or specific complicating feature. The hernia site is approximately at the expected location of a low transverse incision site. 2. Single intrauterine pregnancy observed without specific complicating feature identified. 3. Trace amount of free pelvic fluid in the left lower quadrant near the left ovary. Electronically Signed   By: Gaylyn Rong M.D.   On: 02/23/2018 07:11   Mr Abdomen Wo Contrast  Result Date: 02/23/2018 CLINICAL DATA:  Second trimester pregnancy. Ventral hernia, prior cesarean section. EXAM: MRI ABDOMEN AND PELVIS WITHOUT CONTRAST TECHNIQUE: Multiplanar multisequence MR imaging of the abdomen and pelvis was performed. No intravenous contrast was administered. COMPARISON:  Obstetric ultrasound of 02/22/2018; CT abdomen from 01/14/2016 FINDINGS: Despite efforts by the technologist and patient, motion artifact is present on today's exam and could not be eliminated. This reduces exam sensitivity and specificity. COMBINED FINDINGS FOR BOTH MR ABDOMEN AND PELVIS Lower chest: Unremarkable where included. Hepatobiliary: There are 2 T2 signal hyperintensities each about 5 mm in diameter in the left  hepatic lobe, images 17 and 19 of series 5, probably small cysts or similar benign lesions. Gallbladder unremarkable. Pancreas:  Unremarkable Spleen:  Unremarkable Adrenals/Urinary Tract:  Unremarkable Stomach/Bowel: Pelvic anterior abdominal  wall hernia corresponding roughly to the expected location of a prior low-transverse incision, containing loops of small bowel which have internal air-fluid levels but which are not overtly dilated. The hernia sac measures approximately 13.9 by 4.6 by 10.7 cm, with the neck of the hernia measuring 9.7 by 6.4 cm. There is no evidence of obstruction. Vascular/Lymphatic: Circumaortic left renal vein. Otherwise unremarkable. Reproductive: Single intrauterine pregnancy observed with posterior placenta, no complicating feature identified. Today's exam was not performed for assessment of fetal anatomy. The cervix is long and closed. The amount of amniotic fluid appears appropriate. Transverse lie with footling breech noted. No previa. Other: Trace free fluid in the left lower quadrant adjacent to the left ovary. Musculoskeletal: Unremarkable IMPRESSION: 1. Pelvic ventral hernia containing loops of small bowel without evidence of obstruction or specific complicating feature. The hernia site is approximately at the expected location of a low transverse incision site. 2. Single intrauterine pregnancy observed without specific complicating feature identified. 3. Trace amount of free pelvic fluid in the left lower quadrant near the left ovary. Electronically Signed   By: Gaylyn Rong M.D.   On: 02/23/2018 07:11   [place] Mfm Ob Transvaginal  Result Date: 02/22/2018 ----------------------------------------------------------------------  OBSTETRICS REPORT                      (Signed Final 02/22/2018 05:14 pm) ---------------------------------------------------------------------- Patient Info  ID #:       [id number]                          D.O.B.:  12/28/1980 (37 yrs)  Name:       [name]            Visit Date: 02/22/2018 04:24 pm ---------------------------------------------------------------------- Performed By  Performed By:     Eden Lathe BS      Ref. Address:     [facility]                    RDMS RVT                                                             OB[facility]Gyn Clinic                                                             [address]                                                             [place],  [place]                                                             [phone]  Attending:        Noralee Spaceavi Shankar MD        [facility]         [facility]  Referred By[facility]      [facility]                    [facility]                    [facility]                    [facility] ---------------------------------------------------------------------- Orders   #  Description                                 Code   1  US MFM OB TRANSVAGINAL                      [id number]   2  US MFM OB LIMITED                           [id number]  ----------------------------------------------------------------------   #  Ordered By               Order #        Accession #    Episode #   1  Judeth HornERIN LAWRENCE            [id number]      [id number]     [id number]   2  Judeth HornERIN LAWRENCE            [id number]      [id number]     [id number]  ---------------------------------------------------------------------- Indications   [redacted] weeks gestation of pregnancy                Z3A.21   Pelvic pain affecting pregnancy in second      O26.892   trimester   Previous cesarean delivery X 2, antepartum     3O34.219   Advanced maternal age multigravida 8635+,        104O09.522   second trimester (declined further testing)   Poor obstetric history: Previous preterm       O09.219   delivery X 2 (28, 36 weeks CLASSICAL)   Marginal insertion of umbilical cord affecting O43.192   management of mother in second trimester   ---------------------------------------------------------------------- Fetal Evaluation  Num Of Fetuses:     1  Fetal Heart         140  Rate(bpm):  Cardiac Activity:   Observed  Presentation:       Transverse, head to maternal right  Placenta:           Posterior, above cervical os  P. Cord Insertion:  Marginal insertion  Amniotic Fluid  AFI FV:      Subjectively within normal limits                              Largest Pocket(cm)  5.3 ---------------------------------------------------------------------- Gestational Age  Best:          21w 5d     Det. By:  U/S  (02/15/18)          EDD:   06/30/18 ---------------------------------------------------------------------- Cervix Uterus Adnexa  Cervix  Length:              4  cm.  Normal appearance by transvaginal scan  Uterus  No abnormality visualized.  Cul De Sac:   No free fluid seen.  Adnexa:       No abnormality visualized. ---------------------------------------------------------------------- Impression  A limited ultrasound was performed to evaluate the cervix  (c/o) pelvic pain.  Amniotic fluid is normal and good fetal activity is seen. We  performed transvaginal ultrasound and the cervix measures 4  cm, which is normal. No evidence of placenta previa or  accreta.  Patient had 2 classical cesarean sections. ----------------------------------------------------------------------                  Noralee Space, MD Electronically Signed Final Report   02/22/2018 05:14 pm ----------------------------------------------------------------------  [place] Mfm Ob Limited  Result Date: 02/22/2018 ----------------------------------------------------------------------  OBSTETRICS REPORT                      (Signed Final 02/22/2018 05:14 pm) ---------------------------------------------------------------------- Patient Info  ID #:       [id number]                          D.O.B.:  [birth year] (37 yrs)  Name:       [name]            Visit  Date: 02/22/2018 04:24 pm ---------------------------------------------------------------------- Performed By  Performed By:     Eden Lathe BS      Ref. Address:     [facility]                    RDMS RVT                                                             OB/Gyn Clinic                                                             [address]                                                             [place], [place]  [address]  Attending:        Noralee Space MD        Location:         [facility]  Referred By:      [facility] for                    [facility]                    Healthcare ---------------------------------------------------------------------- Orders   #  Description                                 Code   1  [place] MFM OB TRANSVAGINAL                      91478.2   2  [place] MFM OB LIMITED                           95621.30  ----------------------------------------------------------------------   #  Ordered By               Order #        Accession #    Episode #   1  Judeth Horn            [id number]      [id number]     [id number]   2  Judeth Horn            [id number]      [id number]     [id number]  ---------------------------------------------------------------------- Indications   [redacted] weeks gestation of pregnancy                Z3A.21   Pelvic pain affecting pregnancy in second      O26.892   trimester   Previous cesarean delivery X 2, antepartum     O12.219   Advanced maternal age multigravida 16+,        O24.522   second trimester (declined further testing)   Poor obstetric history: Previous preterm       O09.219   delivery X 2 (28, 36 weeks CLASSICAL)   Marginal insertion of umbilical cord affecting O43.192   management of mother in second trimester  ---------------------------------------------------------------------- Fetal  Evaluation  Num Of Fetuses:     1  Fetal Heart         140  Rate(bpm):  Cardiac Activity:   Observed  Presentation:       Transverse, head to maternal right  Placenta:           Posterior, above cervical os  P. Cord Insertion:  Marginal insertion  Amniotic Fluid  AFI FV:      Subjectively within normal limits                              Largest Pocket(cm)                              5.3 ---------------------------------------------------------------------- Gestational Age  Best:          21w 5d     Det. By:  U/S  (02/15/18)  EDD:   06/30/18 ---------------------------------------------------------------------- Cervix Uterus Adnexa  Cervix  Length:              4  cm.  Normal appearance by transvaginal scan  Uterus  No abnormality visualized.  Cul De Sac:   No free fluid seen.  Adnexa:       No abnormality visualized. ---------------------------------------------------------------------- Impression  A limited ultrasound was performed to evaluate the cervix  (c/o) pelvic pain.  Amniotic fluid is normal and good fetal activity is seen. We  performed transvaginal ultrasound and the cervix measures 4  cm, which is normal. No evidence of placenta previa or  accreta.  Patient had 2 classical cesarean sections. ----------------------------------------------------------------------                  Noralee Space, MD Electronically Signed Final Report   02/22/2018 05:14 pm ----------------------------------------------------------------------   Procedures None  Hospital Course:  Patient is a 37 year old hispanic female who presented from [facility] office with abdominal pain.  Workup showed incisional herni.  Patient was admitted, hernia found to be reducible. Case discussed with OB/GYN and it was felt that operative intervention was not advisable unless hernia was incarcerated. Patient treated with abdominal binder and bowel rest. Diet advanced as tolerated and patient was monitored for signs of obstruction.   On  02/24/18, the patient was voiding well, tolerating diet, ambulating well, pain well controlled, vital signs stable and felt stable for discharge home.  Patient will follow up with her OB as scheduled.    Physical Exam: General appearance: alert, cooperative and no distress Resp: clear to auscultation bilaterally GI: soft, non-tender; bowel sounds normal; no masses,  no organomegaly and ventral hernia is stable, no incarceration.  soft with BS.    Allergies as of 02/24/2018      Reactions   Penicillins Itching, Other (See Comments)   Causes dizziness and burning inside. Has patient had a PCN reaction causing immediate rash, facial/tongue/throat swelling, SOB or lightheadedness with hypotension: No Has patient had a PCN reaction causing severe rash involving mucus membranes or skin necrosis: No Has patient had a PCN reaction that required hospitalization No Has patient had a PCN reaction occurring within the last 10 years: Yes If all of the above answers are "NO", then may proceed with Cephalosporin use.   Tylenol [acetaminophen] Anxiety   Causes itching      Medication List    TAKE these medications   fluticasone 50 MCG/ACT nasal spray Commonly known as:  FLONASE Place 1 spray into both nostrils daily.   prenatal multivitamin Tabs tablet Take 1 tablet by mouth daily at 12 noon.   traMADol 50 MG tablet Commonly known as:  ULTRAM Take 1 tablet (50 mg total) by mouth every 12 (twelve) hours as needed for severe pain.   TRUFORM LITES MATERNITY HOSE Misc Wear daily for support of lower leg varicose veins        Follow-up Information    Luretha Murphy, MD Follow up.   Specialty:  General Surgery Why:  You may follow up with Dr. Daphine Deutscher after you have had your baby if you have any issues with hernia in the future.  Contact information: [address] [place] [address] [phone]        Tilda Burrow, MD Follow up.   Specialties:  Obstetrics and  Gynecology, Radiology Why:  Follow up as planned Contact information: [address] [place] [address] [phone]  Signed: Wells Guiles, [facility] Surgery 02/24/2018, 12:49 PM Pager: [phone] Consults: [phone] Mon-Fri 7:00 am-4:30 pm Sat-Sun 7:00 am-11:30 am

## 2018-03-14 ENCOUNTER — Ambulatory Visit (HOSPITAL_COMMUNITY)
Admission: RE | Admit: 2018-03-14 | Discharge: 2018-03-14 | Disposition: A | Discharge status: Home / Self Care | Payer: Self-pay | Source: Ambulatory Visit | Attending: Family Medicine | Admitting: Family Medicine

## 2018-03-14 ENCOUNTER — Other Ambulatory Visit (HOSPITAL_COMMUNITY): Payer: Self-pay | Admitting: Obstetrics and Gynecology

## 2018-03-14 DIAGNOSIS — O09522 Supervision of elderly multigravida, second trimester: Secondary | ICD-10-CM

## 2018-03-14 DIAGNOSIS — O34219 Maternal care for unspecified type scar from previous cesarean delivery: Secondary | ICD-10-CM

## 2018-03-14 DIAGNOSIS — Z3A24 24 weeks gestation of pregnancy: Secondary | ICD-10-CM

## 2018-03-14 DIAGNOSIS — O43199 Other malformation of placenta, unspecified trimester: Secondary | ICD-10-CM

## 2018-03-14 DIAGNOSIS — Z362 Encounter for other antenatal screening follow-up: Secondary | ICD-10-CM

## 2018-03-14 DIAGNOSIS — O09212 Supervision of pregnancy with history of pre-term labor, second trimester: Secondary | ICD-10-CM | POA: Insufficient documentation

## 2018-03-14 DIAGNOSIS — O43192 Other malformation of placenta, second trimester: Secondary | ICD-10-CM | POA: Insufficient documentation

## 2018-03-22 ENCOUNTER — Ambulatory Visit (INDEPENDENT_AMBULATORY_CARE_PROVIDER_SITE_OTHER): Payer: Self-pay | Admitting: Obstetrics & Gynecology

## 2018-03-22 VITALS — BP 106/56 | HR 82 | Wt 144.0 lb

## 2018-03-22 DIAGNOSIS — K439 Ventral hernia without obstruction or gangrene: Secondary | ICD-10-CM

## 2018-03-22 DIAGNOSIS — O0990 Supervision of high risk pregnancy, unspecified, unspecified trimester: Secondary | ICD-10-CM

## 2018-03-22 DIAGNOSIS — O43199 Other malformation of placenta, unspecified trimester: Secondary | ICD-10-CM

## 2018-03-22 DIAGNOSIS — Z98891 History of uterine scar from previous surgery: Secondary | ICD-10-CM

## 2018-03-22 DIAGNOSIS — O09522 Supervision of elderly multigravida, second trimester: Secondary | ICD-10-CM

## 2018-03-22 NOTE — Progress Notes (Signed)
PRENATAL VISIT NOTE  Subjective:  [name] is a 37 y.o. [phone] at [redacted]w[redacted]d being seen today for ongoing prenatal care. Patient is Spanish-speaking only, Spanish interpreter present for this encounter. She is currently monitored for the following issues for this high-risk pregnancy and has Endometriosis in scar of skin; Supervision of high risk pregnancy, antepartum; History of preterm delivery, currently pregnant; History of cesarean section, classical; AMA (advanced maternal age) multigravida 35+; Marginal insertion of umbilical cord affecting management of mother; Incisional hernia without obstruction or gangrene; and Ventral hernia on their problem list.  Patient reports no hernia pain currently. Was observed last month, hernia noted to be reducible. Observed by General Surgery, no surgery done.  Contractions: Not present. Vag. Bleeding: None.  Movement: Present. Denies leaking of fluid.   The following portions of the patient's history were reviewed and updated as appropriate: allergies, current medications, past family history, past medical history, past social history, past surgical history and problem list. Problem list updated.  Objective:   Vitals:   03/22/18 1551  BP: (!) 106/56  Pulse: 82  Weight: 144 lb (65.3 kg)    Fetal Status: Fetal Heart Rate (bpm): 142 Fundal Height: 24 cm Movement: Present     General:  Alert, oriented and cooperative. Patient is in no acute distress.  Skin: Skin is warm and dry. No rash noted.   Cardiovascular: Normal heart rate noted  Respiratory: Normal respiratory effort, no problems with respiration noted  Abdomen: Soft, gravid, appropriate for gestational age.  Pain/Pressure: Present     Pelvic: Cervical exam deferred        Extremities: Normal range of motion.  Edema: None  Mental Status: Normal mood and affect. Normal behavior. Normal judgment and thought content.  Mr Pelvis Wo Contrast  Result Date: 02/23/2018 CLINICAL DATA:   Second trimester pregnancy. Ventral hernia, prior cesarean section. EXAM: MRI ABDOMEN AND PELVIS WITHOUT CONTRAST TECHNIQUE: Multiplanar multisequence MR imaging of the abdomen and pelvis was performed. No intravenous contrast was administered. COMPARISON:  Obstetric ultrasound of 02/22/2018; CT abdomen from 01/14/2016 FINDINGS: Despite efforts by the technologist and patient, motion artifact is present on today's exam and could not be eliminated. This reduces exam sensitivity and specificity. COMBINED FINDINGS FOR BOTH MR ABDOMEN AND PELVIS Lower chest: Unremarkable where included. Hepatobiliary: There are 2 T2 signal hyperintensities each about 5 mm in diameter in the left hepatic lobe, images 17 and 19 of series 5, probably small cysts or similar benign lesions. Gallbladder unremarkable. Pancreas:  Unremarkable Spleen:  Unremarkable Adrenals/Urinary Tract:  Unremarkable Stomach/Bowel: Pelvic anterior abdominal wall hernia corresponding roughly to the expected location of a prior low-transverse incision, containing loops of small bowel which have internal air-fluid levels but which are not overtly dilated. The hernia sac measures approximately 13.9 by 4.6 by 10.7 cm, with the neck of the hernia measuring 9.7 by 6.4 cm. There is no evidence of obstruction. Vascular/Lymphatic: Circumaortic left renal vein. Otherwise unremarkable. Reproductive: Single intrauterine pregnancy observed with posterior placenta, no complicating feature identified. Today's exam was not performed for assessment of fetal anatomy. The cervix is long and closed. The amount of amniotic fluid appears appropriate. Transverse lie with footling breech noted. No previa. Other: Trace free fluid in the left lower quadrant adjacent to the left ovary. Musculoskeletal: Unremarkable IMPRESSION: 1. Pelvic ventral hernia containing loops of small bowel without evidence of obstruction or specific complicating feature. The hernia site is approximately at the  expected location of a low transverse incision site. 2. Single  intrauterine pregnancy observed without specific complicating feature identified. 3. Trace amount of free pelvic fluid in the left lower quadrant near the left ovary. Electronically Signed   By: Gaylyn Rong M.D.   On: 02/23/2018 07:11   Mr Abdomen Wo Contrast  Result Date: 02/23/2018 CLINICAL DATA:  Second trimester pregnancy. Ventral hernia, prior cesarean section. EXAM: MRI ABDOMEN AND PELVIS WITHOUT CONTRAST TECHNIQUE: Multiplanar multisequence MR imaging of the abdomen and pelvis was performed. No intravenous contrast was administered. COMPARISON:  Obstetric ultrasound of 02/22/2018; CT abdomen from 01/14/2016 FINDINGS: Despite efforts by the technologist and patient, motion artifact is present on today's exam and could not be eliminated. This reduces exam sensitivity and specificity. COMBINED FINDINGS FOR BOTH MR ABDOMEN AND PELVIS Lower chest: Unremarkable where included. Hepatobiliary: There are 2 T2 signal hyperintensities each about 5 mm in diameter in the left hepatic lobe, images 17 and 19 of series 5, probably small cysts or similar benign lesions. Gallbladder unremarkable. Pancreas:  Unremarkable Spleen:  Unremarkable Adrenals/Urinary Tract:  Unremarkable Stomach/Bowel: Pelvic anterior abdominal wall hernia corresponding roughly to the expected location of a prior low-transverse incision, containing loops of small bowel which have internal air-fluid levels but which are not overtly dilated. The hernia sac measures approximately 13.9 by 4.6 by 10.7 cm, with the neck of the hernia measuring 9.7 by 6.4 cm. There is no evidence of obstruction. Vascular/Lymphatic: Circumaortic left renal vein. Otherwise unremarkable. Reproductive: Single intrauterine pregnancy observed with posterior placenta, no complicating feature identified. Today's exam was not performed for assessment of fetal anatomy. The cervix is long and closed. The amount of  amniotic fluid appears appropriate. Transverse lie with footling breech noted. No previa. Other: Trace free fluid in the left lower quadrant adjacent to the left ovary. Musculoskeletal: Unremarkable IMPRESSION: 1. Pelvic ventral hernia containing loops of small bowel without evidence of obstruction or specific complicating feature. The hernia site is approximately at the expected location of a low transverse incision site. 2. Single intrauterine pregnancy observed without specific complicating feature identified. 3. Trace amount of free pelvic fluid in the left lower quadrant near the left ovary. Electronically Signed   By: Gaylyn Rong M.D.   On: 02/23/2018 07:11   [place] Mfm Ob Transvaginal  Result Date: 02/22/2018 ----------------------------------------------------------------------  OBSTETRICS REPORT                      (Signed Final 02/22/2018 05:14 pm) ---------------------------------------------------------------------- Patient Info  ID #:       [id number]                          D.O.B.:  04-Feb-1981 (37 yrs)  Name:       [name]            Visit Date: 02/22/2018 04:24 pm ---------------------------------------------------------------------- Performed By  Performed By:     Eden Lathe BS      Ref. Address:     [facility]                    RDMS RVT                                                             OB/Gyn Clinic  [address]                                                             [place], [place]                                                             [address]  Attending:        Noralee Space MD        Location:         [facility]  Referred By:      [facility] for                    [facility]                    Healthcare ---------------------------------------------------------------------- Orders   #   Description                                 Code   1  [place] MFM OB TRANSVAGINAL                      19147.8   2  [place] MFM OB LIMITED                           29562.13  ----------------------------------------------------------------------   #  Ordered By               Order #        Accession #    Episode #   1  Judeth Horn            [id number]      [id number]     [id number]   2  Judeth Horn            [id number]      [id number]     [id number]  ---------------------------------------------------------------------- Indications   [redacted] weeks gestation of pregnancy                Z3A.21   Pelvic pain affecting pregnancy in second      O26.892   trimester   Previous cesarean delivery X 2, antepartum     O18.219   Advanced maternal age multigravida 52+,        O49.522   second trimester (declined further testing)   Poor obstetric history: Previous preterm       O09.219   delivery X 2 (28, 36 weeks CLASSICAL)   Marginal insertion of umbilical cord affecting O43.192  management of mother in second trimester  ---------------------------------------------------------------------- Fetal Evaluation  Num Of Fetuses:     1  Fetal Heart         140  Rate(bpm):  Cardiac Activity:   Observed  Presentation:       Transverse, head to maternal right  Placenta:           Posterior, above cervical os  P. Cord Insertion:  Marginal insertion  Amniotic Fluid  AFI FV:      Subjectively within normal limits                              Largest Pocket(cm)                              5.3 ---------------------------------------------------------------------- Gestational Age  Best:          21w 5d     Det. By:  U/S  (02/15/18)          EDD:   06/30/18 ---------------------------------------------------------------------- Cervix Uterus Adnexa  Cervix  Length:              4  cm.  Normal appearance by transvaginal scan  Uterus  No abnormality visualized.  Cul De Sac:   No free fluid seen.  Adnexa:       No abnormality visualized.  ---------------------------------------------------------------------- Impression  A limited ultrasound was performed to evaluate the cervix  (c/o) pelvic pain.  Amniotic fluid is normal and good fetal activity is seen. We  performed transvaginal ultrasound and the cervix measures 4  cm, which is normal. No evidence of placenta previa or  accreta.  Patient had 2 classical cesarean sections. ----------------------------------------------------------------------                  Noralee Space, MD Electronically Signed Final Report   02/22/2018 05:14 pm ----------------------------------------------------------------------  [place] Mfm Ob Follow Up  Result Date: 03/14/2018 ----------------------------------------------------------------------  OBSTETRICS REPORT                      (Signed Final 03/14/2018 10:15 am) ---------------------------------------------------------------------- Patient Info  ID #:       [id number]                          D.O.B.:  Apr 26, 1981 (37 yrs)  Name:       [name]            Visit Date: 03/14/2018 09:04 am ---------------------------------------------------------------------- Performed By  Performed By:     Tomma Lightning             Ref. Address:     [facility]                    RDMS,RVT                                                             OB/Gyn Clinic  [address]                                                             [place], [place]                                                             [address]  Attending:        Noralee Space MD        Location:         [facility]  Referred By:      [facility] for                    [facility]                    Healthcare ---------------------------------------------------------------------- Orders   #  Description                                 Code   1  [place] MFM OB  FOLLOW UP                         60454.09  ----------------------------------------------------------------------   #  Ordered By               Order #        Accession #    Episode #   1  Noralee Space             [id number]      [id number]     [id number]  ---------------------------------------------------------------------- Indications   [redacted] weeks gestation of pregnancy                Z3A.24   Previous cesarean delivery X 2, antepartum     O51.219   Advanced maternal age multigravida 16+,        O33.522   second trimester (declined further testing)   Poor obstetric history: Previous preterm       O09.219   delivery X 2 (28, 36 weeks CLASSICAL)   Marginal insertion of umbilical cord affecting O43.192   management of mother in second trimester   Encounter for other antenatal screening        Z36.2   follow-up  ---------------------------------------------------------------------- OB History  Blood Type:            Height:  5'4"   Weight (lb):  143       BMI:  24.54  Gravidity:    3  Term:   2  Living:       2 ---------------------------------------------------------------------- Fetal Evaluation  Num Of Fetuses:     1  Fetal Heart         144  Rate(bpm):  Cardiac Activity:   Observed  Presentation:       Cephalic  Placenta:           Posterior  P. Cord Insertion:  Marginal insertion  Amniotic Fluid  AFI FV:      Subjectively within normal limits                              Largest Pocket(cm)                              3.46 ---------------------------------------------------------------------- Biometry  BPD:      58.2  mm     G. Age:  23w 6d         18  %    CI:        74.64   %    70 - 86                                                          FL/HC:      21.5   %    18.7 - 20.3  HC:      213.8  mm     G. Age:  23w 3d          5  %    HC/AC:      1.04        1.04 - 1.22  AC:      205.3  mm     G. Age:  25w 1d         58  %    FL/BPD:     78.9   %    71 - 87  FL:       45.9  mm     G. Age:  25w 2d         58   %    FL/AC:      22.4   %    20 - 24  HUM:      41.7  mm     G. Age:  25w 1d         56  %  Est. FW:     749  gm    1 lb 10 oz      59  % ---------------------------------------------------------------------- Gestational Age  LMP:           23w 1d        Date:  10/03/17                 EDD:   07/10/18  U/S Today:     24w 3d                                        EDD:   07/01/18  Best:          24w 4d     Det. By:  U/S  (02/15/18)          EDD:   06/30/18 ---------------------------------------------------------------------- Anatomy  Cranium:               Appears normal         LVOT:                   Previously seen  Cavum:                 Previously seen        Aortic Arch:            Appears normal  Ventricles:            Previously seen        Ductal Arch:            Previously seen  Choroid Plexus:        Previously seen        Diaphragm:              Previously seen  Cerebellum:            Previously seen        Stomach:                Appears normal, left                                                                        sided  Posterior Fossa:       Previously seen        Abdomen:                Previously seen  Nuchal Fold:           Previously seen        Abdominal Wall:         Previously seen  Face:                  Orbits and profile     Cord Vessels:           Previously seen                         previously seen  Lips:                  Previously seen        Kidneys:                Appear normal  Palate:                Not well visualized    Bladder:                Appears normal  Thoracic:              Appears normal         Spine:                  Previously seen  Heart:                 Previously seen        Upper Extremities:      Previously  Visualized  RVOT:                  Previously seen        Lower Extremities:      Previously seen  Other:  Fetus appears to be a female prev seen. Heels prev visualized.  ---------------------------------------------------------------------- Cervix Uterus Adnexa  Cervix  Not visualized (advanced GA >24wks) ---------------------------------------------------------------------- Impression  Amniotic fluid is normal and good fetal activity is seen.  Marginal cord insertion is seen again.  Fetal growth is appropriate for gestational age. ---------------------------------------------------------------------- Recommendations  Fetal growth assessment in 4 weeks. ----------------------------------------------------------------------                  Noralee Space, MD Electronically Signed Final Report   03/14/2018 10:15 am ----------------------------------------------------------------------  [place] Mfm Ob Limited  Result Date: 02/22/2018 ----------------------------------------------------------------------  OBSTETRICS REPORT                      (Signed Final 02/22/2018 05:14 pm) ---------------------------------------------------------------------- Patient Info  ID #:       [id number]                          D.O.B.:  September 25, 1980 (37 yrs)  Name:       [name]            Visit Date: 02/22/2018 04:24 pm ---------------------------------------------------------------------- Performed By  Performed By:     Eden Lathe BS      Ref. Address:     [facility]                    RDMS RVT                                                             OB/Gyn Clinic                                                             [address]                                                             [place], [place]                                                             [address]  Attending:  Noralee Space MD        Location:         [facility]  Referred By:      [facility] for                    [facility]                    Healthcare  ---------------------------------------------------------------------- Orders   #  Description                                 Code   1  [place] MFM OB TRANSVAGINAL                      78295.6   2  [place] MFM OB LIMITED                           21308.65  ----------------------------------------------------------------------   #  Ordered By               Order #        Accession #    Episode #   1  Judeth Horn            [id number]      [id number]     [id number]   2  Judeth Horn            [id number]      [id number]     [id number]  ---------------------------------------------------------------------- Indications   [redacted] weeks gestation of pregnancy                Z3A.21   Pelvic pain affecting pregnancy in second      O26.892   trimester   Previous cesarean delivery X 2, antepartum     O24.219   Advanced maternal age multigravida 6+,        O81.522   second trimester (declined further testing)   Poor obstetric history: Previous preterm       O09.219   delivery X 2 (28, 36 weeks CLASSICAL)   Marginal insertion of umbilical cord affecting O43.192   management of mother in second trimester  ---------------------------------------------------------------------- Fetal Evaluation  Num Of Fetuses:     1  Fetal Heart         140  Rate(bpm):  Cardiac Activity:   Observed  Presentation:       Transverse, head to maternal right  Placenta:           Posterior, above cervical os  P. Cord Insertion:  Marginal insertion  Amniotic Fluid  AFI FV:      Subjectively within normal limits                              Largest Pocket(cm)                              5.3 ---------------------------------------------------------------------- Gestational Age  Best:          21w 5d     Det. By:  U/S  (02/15/18)          EDD:   06/30/18 ---------------------------------------------------------------------- Cervix Uterus Adnexa  Cervix  Length:              4  cm.  Normal appearance by transvaginal scan  Uterus  No abnormality visualized.  Cul De Sac:    No free fluid seen.  Adnexa:       No abnormality visualized. ---------------------------------------------------------------------- Impression  A limited ultrasound was performed to evaluate the cervix  (c/o) pelvic pain.  Amniotic fluid is normal and good fetal activity is seen. We  performed transvaginal ultrasound and the cervix measures 4  cm, which is normal. No evidence of placenta previa or  accreta.  Patient had 2 classical cesarean sections. ----------------------------------------------------------------------                  Noralee Space, MD Electronically Signed Final Report   02/22/2018 05:14 pm ----------------------------------------------------------------------   Assessment and Plan:  Pregnancy: [id number] at [redacted]w[redacted]d  1. Ventral hernia without obstruction or gangrene Pain precautions reviewed  2. Marginal insertion of umbilical cord affecting management of mother Follow up scan ordered as per MFM - [place] MFM OB FOLLOW UP; Future  3. Multigravida of advanced maternal age in second trimester Stable  4. History of cesarean section, classical RCS to be scheduled at 37 weeks  5. Supervision of high risk pregnancy, antepartum Preterm labor symptoms and general obstetric precautions including but not limited to vaginal bleeding, contractions, leaking of fluid and fetal movement were reviewed in detail with the patient. Please refer to After Visit Summary for other counseling recommendations.  Return in about 1 month (around 04/19/2018) for 2 hr GTT, 3rd trimester labs, TDap, OB Visit (HOB).   Jaynie Collins, MD

## 2018-03-22 NOTE — Patient Instructions (Addendum)
Vacuna Tdap (contra la difteria, el ttanos y [place]): Lo que debe saber (Tdap Vaccine [Tetanus, Diphtheria, and Pertussis]: What You Need to Know) 1. Por qu vacunarse? El ttanos, la difteria y la tosferina son enfermedades muy graves. La vacuna Tdap nos puede proteger de estas enfermedades. Adems, la vacuna Tdap que se aplica a las Chemical engineer a los bebs recin nacidos contra la tosferina. En la actualidad, el [place] (trismo) es una enfermedad poco frecuente en los [place]. Provoca la contraccin y el endurecimiento dolorosos de los msculos, por lo general, de todo el cuerpo.  Puede causar el endurecimiento de los msculos de la cabeza y el cuello, de modo que impide abrir la boca, tragar y en algunos casos, Ambulance person. El ttanos causa la muerte de aproximadamente 1de cada 10personas que contraen la infeccin, incluso despus de que reciben la mejor atencin mdica. La DIFTERIA tambin es poco frecuente en los [place] [employer]. Puede causar la formacin de una membrana gruesa en la parte posterior de la garganta.  Esto tiene como consecuencia problemas respiratorios, insuficiencia cardaca, parlisis y [place]. La TOSFERINA (tos convulsa) provoca episodios de tos intensa que pueden dificultar la respiracin y provocar vmitos y trastornos del sueo.  Tambin puede causar prdida de peso, incontinencia y fractura de [place]. Dos de cada 100 adolescentes y 5 de cada 100 adultos con tosferina deben ser hospitalizados o tienen complicaciones, que podran incluir neumona y [place]. Estas enfermedades son provocadas por bacterias. La difteria y la tosferina se contagian de Ardelia Mems persona a otra a travs de las secreciones de la tos o el estornudo. El ttanos ingresa al organismo a travs de cortes, rasguos o heridas. Antes de las vacunas, en los [place] se informaban 200000 casos de difteria, 200000 casos de tosferina y cientos de casos de ttanos cada  ao. Desde el inicio de la vacunacin, los informes de casos de ttanos y difteria han disminuido alrededor del 99%, y de tosferina, alrededor del 80%. 2. [name] Tdap La vacuna Tdap protege a adolescentes y adultos contra el ttanos, la difteria y la tosferina. Una dosis de Tdap se administra a los 80 o 12 aos. Las [place] [employer] no recibieron la vacuna Tdap a esa edad deben recibirla tan pronto como sea posible. Es muy importante que los mdicos y todos aquellos que tengan contacto cercano con bebs menores de 62meses reciban la vacuna Tdap. Las mujeres deben recibir una dosis de Tdap en cada Media planner, para proteger al recin nacido de la tosferina. Los nios tienen mayor riesgo de complicaciones graves y potencialmente mortales debido a la tosferina. Otra vacuna llamada Td protege contra el ttanos y la difteria, pero no contra la tosferina. Todos deben recibir una dosis de refuerzo de Td cada 10 aos. La Tdap puede aplicarse como uno de estos refuerzos si nunca antes recibi esta vacuna. Tambin se puede aplicar despus de un corte o quemadura grave para prevenir la infeccin por ttanos. El mdico o la persona que le aplique la vacuna puede darle ms informacin al [employer]. La Tdap puede administrarse de manera segura simultneamente con otras vacunas. 3. Algunas personas no deben recibir la [employer] persona que alguna vez tuvo una reaccin alrgica potencialmente mortal a Ardelia Mems dosis previa de cualquier vacuna contra el ttanos, la difteria o la tosferina, O que tenga una alergia grave a cualquiera de los componentes de esta vacuna, no debe recibir la vacuna Tdap. Informe a la Web designer la vacuna si tiene  cualquier alergia grave.  Una persona que estuvo en estado de coma o sufri mltiples convulsiones en el trmino de los 7das despus de recibir una dosis de DTP o DTaP, o una dosis previa de Tdap, no debe recibir la vacuna Tdap, salvo que se haya encontrado otra causa que no fuera  la vacuna. An puede recibir la Td.  Consulte con su mdico si: ? tiene convulsiones u otro problema del sistema nervioso, ? tuvo hinchazn o dolor intenso despus de cualquier vacuna contra la difteria o el ttanos, ? alguna vez ha sufrido el sndrome de Curator, ? no se siente Pharmacologist en que se ha programado la vacuna.  4. Riesgos Con cualquier medicamento, incluyendo las vacunas, existe la posibilidad de que aparezcan efectos secundarios. Suelen ser leves y desaparecen por s solos. Tambin son posibles las reacciones graves, pero en raras ocasiones. La [employer] de las personas a las que se les aplica la vacuna Tdap no tienen ningn problema. Problemas leves despus de la vacuna Tdap (No interfirieron en otras actividades)  Dolor en el lugar donde se aplic la vacuna (alrededor de 3 de cada 4 adolescentes o 2 de cada 3 adultos).  Enrojecimiento o hinchazn en el lugar donde se aplic la vacuna (1 de cada 5 personas).  Fiebre leve de al menos 100,77F (38C) (hasta alrededor de 1 cada 25 adolescentes o 1 de cada 100 adultos).  Dolor de cabeza (alrededor de 3 o 4 de cada 10 personas).  Cansancio (alrededor de 1 de cada 3 o 4 personas).  Nuseas, vmitos, diarrea, dolor de estmago (hasta 1 de cada 4 adolescentes o 1 de cada 10 adultos).  Escalofros, dolores articulares (alrededor de 1de cada 10personas).  Dolores corporales (alrededor de 1de cada 3 o 4personas).  Erupcin cutnea, inflamacin de los ganglios (poco frecuente). Problemas moderados despus de recibir la vacuna Tdap (Interfirieron en otras actividades, pero no requirieron atencin mdica)  Management consultant donde se aplic la vacuna (hasta 1de cada 5 o 6).  Enrojecimiento o inflamacin en el lugar donde se aplic la vacuna (hasta alrededor de 1 de cada 16adolescentes o 1 de cada 12adultos).  Fiebre de ms de 102F (38,8C) (alrededor de 1 de cada 100 adolescentes o 1 de cada 250  adultos).  Dolor de cabeza (alrededor de 1de cada 7adolescentes o 1de cada 10adultos).  Nuseas, vmitos, diarrea, dolor de estmago (hasta 1 o 3 de cada 100 personas).  Hinchazn de todo el brazo en el que se aplic la vacuna (hasta alrededor de 1de cada 500personas). Problemas graves despus de la vacuna Tdap (Impidieron Optometrist las actividades habituales; requirieron atencin mdica)  Inflamacin, dolor intenso, sangrado y enrojecimiento en el brazo en que se aplic la vacuna (poco frecuente). Problemas que podran ocurrir despus de cualquier vacuna:  Las personas a veces se desmayan despus de un procedimiento mdico, incluida la vacunacin. Si permanece sentado o recostado durante 15 minutos puede ayudar a [employer] y las lesiones causadas por las cadas. Informe al mdico si se siente mareado, tiene cambios en la visin o zumbidos en los odos.  Algunas personas sienten un dolor intenso en el hombro y tienen dificultad para mover el brazo donde se coloc la vacuna. Esto sucede con muy poca frecuencia.  Cualquier medicamento puede causar una reaccin alrgica grave. Dichas reacciones son Orlene Erm poco frecuentes con una vacuna (se calcula que menos de 1en un milln de dosis) y se producen de unos minutos a unas horas despus de Writer.  Al igual que con cualquier Automatic Data, existe una probabilidad muy remota de que una vacuna cause una lesin grave o la [place]. Se controla permanentemente la seguridad de las vacunas. Para obtener ms informacin, visite: http://floyd.org/. 5. Qu pasa si hay un problema grave? A qu signos debo estar atento?  Observe todo lo que le preocupe, como signos de una reaccin alrgica grave, fiebre muy alta o comportamiento fuera de lo normal.  Los signos de una reaccin alrgica grave pueden incluir ronchas, hinchazn de la cara y la garganta, dificultad para respirar, latidos cardacos acelerados, mareos y debilidad.  Generalmente, estos comenzaran entre unos pocos minutos y algunas horas despus de la vacunacin. Qu debo hacer?  Si usted piensa que se trata de una reaccin alrgica grave o de otra emergencia que no puede esperar, llame al 911 o lleve a la persona al hospital ms cercano. Sino, llame a su mdico.  Despus, la reaccin debe informarse al [address] de Informacin sobre Efectos Adversos de las [place] (Vaccine Adverse Event Reporting System, VAERS). Su mdico puede presentar este informe, o puede hacerlo usted mismo a travs del sitio web de VAERS, en www.vaers.LAgents.no, o llamando al [phone]. VAERS no brinda recomendaciones mdicas. 6. [employer] de Compensacin de Daos por [employer] [place] de Compensacin de Daos por Administrator, arts (National Vaccine Injury Compensation Program, VICP) es un programa federal que fue creado para Patent examiner a las personas que puedan haber sufrido daos al recibir ciertas vacunas. Aquellas personas que consideren que han sufrido un dao como consecuencia de una vacuna y [place] saber ms acerca del programa y de cmo presentar Roslynn Amble, pueden llamar al [phone] o visitar su sitio web en SpiritualWord.at. Hay un lmite de tiempo para presentar un reclamo de compensacin. 7. Cmo puedo obtener ms informacin?  Consulte a su mdico. Este puede darle el prospecto de la vacuna o recomendarle otras fuentes de informacin.  Comunquese con el servicio de salud de su localidad o [address].  Comunquese con los Centros para Air traffic controller y la Prevencin de Child psychotherapist for Disease Control and Prevention , CDC). ? Llame al [phone] (1-800-CDC-INFO), o ? visite el sitio web [employer] en PicCapture.uy. Declaracin de informacin sobre la vacuna contra la difteria, el ttanos y la tosferina (Tdap) de los CDC (24/02/15) Esta informacin no tiene Theme park manager el consejo del mdico. Asegrese de hacerle al  mdico cualquier pregunta que tenga. Document Released: 07/19/2012 Document Revised: 08/23/2014 Document Reviewed: 11/14/2013 Elsevier Interactive Patient Education  2017 Elsevier Inc.   TDaP Vaccine Pregnancy Get the Whooping Cough Vaccine While You Are Pregnant (CDC)  It is important for women to get the whooping cough vaccine in the third trimester of each pregnancy. Vaccines are the best way to prevent this disease. There are 2 different whooping cough vaccines. Both vaccines combine protection against whooping cough, tetanus and diphtheria, but they are for different age groups: Tdap: for everyone 11 years or older, including pregnant women  DTaP: for children 2 months through 61 years of age  You need the whooping cough vaccine during each of your pregnancies The recommended time to get the shot is during your 27th through 36th week of pregnancy, preferably during the earlier part of this time period. The Centers for Disease Control and Prevention (CDC) recommends that pregnant women receive the whooping cough vaccine for adolescents and adults (called Tdap vaccine) during the third trimester of each pregnancy. The recommended time to get the shot is during your 27th through  36th week of pregnancy, preferably during the earlier part of this time period. This replaces the original recommendation that pregnant women get the vaccine only if they had not previously received it. The [employer] Corporationmerican College of Obstetricians and Gynecologists and the [employer] College of Nurse-Midwives support this recommendation.  You should get the whooping cough vaccine while pregnant to pass protection to your baby frame support disabled and/or not supported in this browser  Learn why Mary RiegerLaura decided to get the whooping cough vaccine in her 3rd trimester of pregnancy and how her baby girl was born with some protection against the disease. Also available on YouTube. After receiving the whooping cough vaccine, your body  will create protective antibodies (proteins produced by the body to fight off diseases) and pass some of them to your baby before birth. These antibodies provide your baby some short-term protection against whooping cough in early life. These antibodies can also protect your baby from some of the more serious complications that come along with whooping cough. Your protective antibodies are at their highest about 2 weeks after getting the vaccine, but it takes time to pass them to your baby. So the preferred time to get the whooping cough vaccine is early in your third trimester. The amount of whooping cough antibodies in your body decreases over time. That is why CDC recommends you get a whooping cough vaccine during each pregnancy. Doing so allows each of your babies to get the greatest number of protective antibodies from you. This means each of your babies will get the best protection possible against this disease.  Getting the whooping cough vaccine while pregnant is better than getting the vaccine after you give birth Whooping cough vaccination during pregnancy is ideal so your baby will have short-term protection as soon as he is born. This early protection is important because your baby will not start getting his whooping cough vaccines until he is 2 months old. These first few months of life are when your baby is at greatest risk for catching whooping cough. This is also when he's at greatest risk for having severe, potentially life-threating complications from the infection. To avoid that gap in protection, it is best to get a whooping cough vaccine during pregnancy. You will then pass protection to your baby before he is born. To continue protecting your baby, he should get whooping cough vaccines starting at 2 months old. You may never have gotten the Tdap vaccine before and did not get it during this pregnancy. If so, you should make sure to get the vaccine immediately after you give birth, before  leaving the hospital or birthing center. It will take about 2 weeks before your body develops protection (antibodies) in response to the vaccine. Once you have protection from the vaccine, you are less likely to give whooping cough to your newborn while caring for him. But remember, your baby will still be at risk for catching whooping cough from others. A recent study looked to see how effective Tdap was at preventing whooping cough in babies whose mothers got the vaccine while pregnant or in the hospital after giving birth. The study found that getting Tdap between 27 through 36 weeks of pregnancy is 85% more effective at preventing whooping cough in babies younger than 2 months old. Blood tests cannot tell if you need a whooping cough vaccine There are no blood tests that can tell you if you have enough antibodies in your body to protect yourself or your baby against whooping cough.  Even if you have been sick with whooping cough in the past or previously received the vaccine, you still should get the vaccine during each pregnancy. Breastfeeding may pass some protective antibodies onto your baby By breastfeeding, you may pass some antibodies you have made in response to the vaccine to your baby. When you get a whooping cough vaccine during your pregnancy, you will have antibodies in your breast milk that you can share with your baby as soon as your milk comes in. However, your baby will not get protective antibodies immediately if you wait to get the whooping cough vaccine until after delivering your baby. This is because it takes about 2 weeks for your body to create antibodies. Learn more about the health benefits of breastfeeding.  AREA PEDIATRIC/FAMILY PRACTICE PHYSICIANS  [facility] [address] [place], [place]  [address] Phone - [phone]   Fax - [phone]  [facility] [address] [place], [place] [address] Phone -  [phone]   Fax - [phone]  [facility] AM[facility] [address] [place], [place]  [address] Phone - [phone]   Fax - [phone]  [facility] [address] [place], [place]  [address] Phone - [phone]   Fax - [phone]  [facility] [place] [address] [place], [place]  [address] Phone - [phone]   Fax - [phone]  [facility] [address] [place], [place]  [address] Phone - [phone]   Fax - [phone]  [facility] [place] [address] [place], [place]  [address] Phone - [phone]   Fax - [phone]  [facility] AT [facility] [address] [place], [address] [place], [place]  [address] Phone - [phone]   Fax - [phone]  [facility] AT [facility] [address] [place], [place]  [address] Phone - [phone]   Fax - [phone] [facility] AT [place] [address] [place], [place]  [address] Phone - [phone]   Fax - [phone]  [facility] [address] [place], [place]  [address] Phone - [phone]   Fax - [phone]  [facility] [address] [place], [place]  [address] Phone - [phone]   Fax - [phone]  [facility] [place] [address] [place], [place]  [address] Phone - [phone]   Fax - [phone]  [facility] [address] [place], [place]  [address] Phone - [phone]  [facility] [address] [place], [place]  [address] Phone - [phone]   Fax - [phone]  [facility] [address] [place], [place]  [address] Phone - [phone]   Fax - [phone]  [facility] [address] [place], [place]  [address] Phone - [phone]   Fax - [phone]   FAMILY MEDICINE [address] [place], [place]   [address] Phone - [phone]   Fax - [phone]   [facility] [address] [place], [place]  [address] Phone - [phone]   Fax - [phone]  [facility] [address] [place], [place]  [address] Phone - [phone]   Fax - [phone]  DAVID RUBIN [address] [place], [place]  [address] Phone - [phone]   Fax - [phone]  [facility] [address] [place],  [place]  [address] Phone - [phone]   Fax - [phone]  [facility] [address] [place], [place]  [address] Phone - [phone]   Fax - [phone] Gerarda Fraction [phone] [place], [place]  [address] Phone - [phone]   Fax - [phone]  [facility] [address] [place], [place]  [address] Phone - [phone]   Fax - [phone]  [facility] - [place] [address] [place], [place]  [address] Phone - [phone]   Fax - [phone]  [facility] - [place] Wyvonne Lenz MD [address] [place] [address] Phone [phone]  Fax [phone]

## 2018-04-11 ENCOUNTER — Inpatient Hospital Stay (HOSPITAL_COMMUNITY)
Admission: AD | Admit: 2018-04-11 | Discharge: 2018-04-11 | Disposition: A | Discharge status: Home / Self Care | Payer: Self-pay | Source: Ambulatory Visit | Attending: Obstetrics and Gynecology | Admitting: Obstetrics and Gynecology

## 2018-04-11 ENCOUNTER — Encounter (HOSPITAL_COMMUNITY): Payer: Self-pay

## 2018-04-11 DIAGNOSIS — O26892 Other specified pregnancy related conditions, second trimester: Secondary | ICD-10-CM | POA: Insufficient documentation

## 2018-04-11 DIAGNOSIS — O4702 False labor before 37 completed weeks of gestation, second trimester: Secondary | ICD-10-CM

## 2018-04-11 DIAGNOSIS — O0990 Supervision of high risk pregnancy, unspecified, unspecified trimester: Secondary | ICD-10-CM

## 2018-04-11 DIAGNOSIS — O43199 Other malformation of placenta, unspecified trimester: Secondary | ICD-10-CM

## 2018-04-11 DIAGNOSIS — R109 Unspecified abdominal pain: Secondary | ICD-10-CM

## 2018-04-11 DIAGNOSIS — R1030 Lower abdominal pain, unspecified: Secondary | ICD-10-CM | POA: Insufficient documentation

## 2018-04-11 DIAGNOSIS — Z79899 Other long term (current) drug therapy: Secondary | ICD-10-CM | POA: Insufficient documentation

## 2018-04-11 DIAGNOSIS — O99612 Diseases of the digestive system complicating pregnancy, second trimester: Secondary | ICD-10-CM | POA: Insufficient documentation

## 2018-04-11 DIAGNOSIS — Z98891 History of uterine scar from previous surgery: Secondary | ICD-10-CM

## 2018-04-11 DIAGNOSIS — Z3A27 27 weeks gestation of pregnancy: Secondary | ICD-10-CM | POA: Insufficient documentation

## 2018-04-11 DIAGNOSIS — K432 Incisional hernia without obstruction or gangrene: Secondary | ICD-10-CM | POA: Insufficient documentation

## 2018-04-11 DIAGNOSIS — Z880 Allergy status to penicillin: Secondary | ICD-10-CM | POA: Insufficient documentation

## 2018-04-11 DIAGNOSIS — O26899 Other specified pregnancy related conditions, unspecified trimester: Secondary | ICD-10-CM

## 2018-04-11 DIAGNOSIS — Z886 Allergy status to analgesic agent status: Secondary | ICD-10-CM | POA: Insufficient documentation

## 2018-04-11 DIAGNOSIS — O09219 Supervision of pregnancy with history of pre-term labor, unspecified trimester: Secondary | ICD-10-CM

## 2018-04-11 DIAGNOSIS — O09899 Supervision of other high risk pregnancies, unspecified trimester: Secondary | ICD-10-CM

## 2018-04-11 LAB — WET PREP, GENITAL
Clue Cells Wet Prep HPF POC: NONE SEEN
Sperm: NONE SEEN
Trich, Wet Prep: NONE SEEN
Yeast Wet Prep HPF POC: NONE SEEN

## 2018-04-11 LAB — FETAL FIBRONECTIN: Fetal Fibronectin: NEGATIVE

## 2018-04-11 MED ORDER — NIFEDIPINE 10 MG PO CAPS
10.0000 mg | ORAL_CAPSULE | ORAL | Status: DC | PRN
  Administered 2018-04-11 (×2): 10 mg via ORAL
  Filled 2018-04-11 (×2): qty 1

## 2018-04-11 MED ORDER — OXYCODONE HCL 5 MG PO TABS
5.0000 mg | ORAL_TABLET | Freq: Once | ORAL | Status: AC
Start: 2018-04-11 — End: 2018-04-11
  Administered 2018-04-11: 5 mg via ORAL
  Filled 2018-04-11: qty 1

## 2018-04-11 MED ORDER — LACTATED RINGERS IV BOLUS
1000.0000 mL | Freq: Once | INTRAVENOUS | Status: AC
  Administered 2018-04-11: 1000 mL via INTRAVENOUS

## 2018-04-11 NOTE — MAU Provider Note (Addendum)
History     CSN: [id number]  Arrival date and time: 04/11/18 7829   First Provider Initiated Contact with Patient 04/11/18 0114      Chief Complaint  Patient presents with  . Abdominal Pain   [name] is a 37 y.o. G3P2 at [redacted]w[redacted]d who presents to MAU with complaints of abdominal pain. She reports abdominal pain that started occurring 30 minutes prior to arrival to MAU. She describes the abdominal pain as lower abdominal cramping like "menstrual cramps". Reports abdominal pain as intermittent abdominal cramping that sometimes are not painful all the time, reports 2 sharps cramps that have occurred since being in MAU. She rates pain 6/10- has not taken any medication for abdominal pain. She reports pregnancy is complicated by vertical C/S scar and incisional hernia. She denies vaginal discharge, vaginal bleeding and LOF.     OB History    Gravida  3   Para  2   Term  0   Preterm  2   AB  0   Living  2     SAB  0   TAB  0   Ectopic  0   Multiple  0   Live Births  2           Past Medical History:  Diagnosis Date  . Endometriosis in scar of skin   . PONV (postoperative nausea and vomiting)     Past Surgical History:  Procedure Laterality Date  . CESAREAN SECTION     X2  . LAPAROTOMY  11/15/2011   Procedure: EXPLORATORY LAPAROTOMY;  Surgeon: Marlane Hatcher, MD;  Location: [facility];  Service: General;  Laterality: N/A;  Exploration and Excision of Endometrial Implant  . MASS EXCISION N/A 04/09/2013   Procedure: EXCISION OF ENDOMETRIOMA;  Surgeon: Marlane Hatcher, MD;  Location: [facility];  Service: General;  Laterality: N/A;  . OVARIAN CYST REMOVAL  03/2016   3 surgeries to remove ovarian cysts    Family History  Problem Relation Age of Onset  . Diabetes Mother   . Cancer Mother        UTERINE   . Cancer Brother        LEUKEMIA  . Anesthesia problems Neg Hx   . Hypotension Neg Hx   . Malignant hyperthermia Neg Hx   . Pseudochol deficiency  Neg Hx     Social History   Tobacco Use  . Smoking status: Never Smoker  . Smokeless tobacco: Never Used  Substance Use Topics  . Alcohol use: No    Alcohol/week: 0.0 standard drinks  . Drug use: No    Allergies:  Allergies  Allergen Reactions  . Penicillins Itching and Other (See Comments)    Causes dizziness and burning inside.  Has patient had a PCN reaction causing immediate rash, facial/tongue/throat swelling, SOB or lightheadedness with hypotension: No Has patient had a PCN reaction causing severe rash involving mucus membranes or skin necrosis: No Has patient had a PCN reaction that required hospitalization No Has patient had a PCN reaction occurring within the last 10 years: Yes If all of the above answers are "NO", then may proceed with Cephalosporin use.   . Tylenol [Acetaminophen] Anxiety    Causes itching    Medications Prior to Admission  Medication Sig Dispense Refill Last Dose  . fluticasone (FLONASE) 50 MCG/ACT nasal spray Place 1 spray into both nostrils daily. 16 g 2 Past Week at Unknown time  . Prenatal Vit-Fe Fumarate-FA (PRENATAL MULTIVITAMIN) TABS tablet Take  1 tablet by mouth daily at 12 noon.   04/10/2018 at Unknown time  . Elastic Bandages & Supports (TRUFORM LITES MATERNITY HOSE) MISC Wear daily for support of lower leg varicose veins 1 each 0 Taking    Review of Systems  Constitutional: Negative.   Respiratory: Negative.   Cardiovascular: Negative.   Gastrointestinal: Positive for abdominal pain. Negative for constipation, diarrhea, nausea and vomiting.  Genitourinary: Negative.   Musculoskeletal: Negative.   Neurological: Negative.    Physical Exam   Vitals:   04/11/18 0041 04/11/18 0234 04/11/18 0401 04/11/18 0450  BP: (!) 107/59 104/65 (!) 94/54 (!) 94/54  Pulse: 71     Resp: 18     Temp: (!) 97.5 F (36.4 C)     TempSrc: Oral     SpO2: 99%     Weight: 68 kg     Height: 5\' 4"  (1.626 m)      Physical Exam  Nursing note and  vitals reviewed. Constitutional: She is oriented to person, place, and time. She appears well-developed and well-nourished. No distress.  HENT:  Head: Normocephalic.  Cardiovascular: Normal rate, regular rhythm and normal heart sounds.  Respiratory: Effort normal and breath sounds normal. No respiratory distress. She has no wheezes.  GI: Soft. Bowel sounds are normal. There is no tenderness.  Genitourinary: Vagina normal. No vaginal discharge found.  Musculoskeletal: Normal range of motion. She exhibits no edema.  Neurological: She is alert and oriented to person, place, and time.  Skin: Skin is warm and dry.  Incisional hernia noted, reducable, non tender   Psychiatric: She has a normal mood and affect. Her behavior is normal. Thought content normal.   Dilation: Closed Effacement (%): Thick Cervical Position: Posterior Exam by:: V Doryan Bahl CNM  FHR: 130/ moderate variability/ + accels/ no decelerations  Toco: occasional mild contractions by palpation with UI   MAU Course  Procedures  MDM IV LR bolus  Hydration with pitcher of water  Procardia x2  Oxycodone  Wet prep  GC/C  FFN  Results for orders placed or performed during the hospital encounter of 04/11/18 (from the past 24 hour(s))  Wet prep, genital     Status: Abnormal   Collection Time: 04/11/18  1:34 AM  Result Value Ref Range   Yeast Wet Prep HPF POC NONE SEEN NONE SEEN   Trich, Wet Prep NONE SEEN NONE SEEN   Clue Cells Wet Prep HPF POC NONE SEEN NONE SEEN   WBC, Wet Prep HPF POC FEW (A) NONE SEEN   Sperm NONE SEEN   Fetal fibronectin     Status: None   Collection Time: 04/11/18  2:27 AM  Result Value Ref Range   Fetal Fibronectin NEGATIVE NEGATIVE   Wet prep - negative  FFN- negative  GC/C pending   Patient reports decreased abdominal cramping to 1/10 after treatments in MAU. Patient cervical examination unchanged. Pt discharged with strict PTL precautions. Discussed reasons to return to MAU for evaluation.  Patient verbalizes understanding. Encourage use of Maternity support belt with activity to prevent worsening of hernia.  Assessment and Plan   1. Preterm uterine contractions in second trimester, antepartum   2. [redacted] weeks gestation of pregnancy   3. Abdominal cramping affecting pregnancy, antepartum   4. Incisional hernia, without obstruction or gangrene    Discharge home Follow up as scheduled for prenatal appointments  Continue Maternity support belt  Return to MAU as needed for PTL s/s    Follow-up Information    [facility].   Specialty:  Obstetrics and Gynecology Why:  Follow up as scheduled for prenatal appointment and return to MAU as needed Contact information: [address] [place] [phone]         Allergies as of 04/11/2018      Reactions   Penicillins Itching, Other (See Comments)   Causes dizziness and burning inside. Has patient had a PCN reaction causing immediate rash, facial/tongue/throat swelling, SOB or lightheadedness with hypotension: No Has patient had a PCN reaction causing severe rash involving mucus membranes or skin necrosis: No Has patient had a PCN reaction that required hospitalization No Has patient had a PCN reaction occurring within the last 10 years: Yes If all of the above answers are "NO", then may proceed with Cephalosporin use.   Tylenol [acetaminophen] Anxiety   Causes itching      Medication List    TAKE these medications   fluticasone 50 MCG/ACT nasal spray Commonly known as:  FLONASE Place 1 spray into both nostrils daily.   prenatal multivitamin Tabs tablet Take 1 tablet by mouth daily at 12 noon.   TRUFORM LITES MATERNITY HOSE Misc Wear daily for support of lower leg varicose veins        Sharyon CableVeronica C Gracia Saggese CNM 04/11/2018, 4:36 AM

## 2018-04-11 NOTE — MAU Note (Signed)
Pt here with c/o abdominal pain that started about 20 minutes ago. Denies any bleeding or leaking of fluid. Reports good fetal movement.

## 2018-04-11 NOTE — Discharge Instructions (Signed)
Dolor abdominal en el embarazo °(Abdominal Pain During Pregnancy) °El dolor abdominal es frecuente durante el embarazo. Generalmente no causa ningún daño. El dolor abdominal puede tener numerosas causas. Algunas causas son más graves que otras. Ciertas causas de dolor abdominal durante el embarazo se diagnostican fácilmente. A veces, se tarda un tiempo para llegar al diagnóstico. Otras veces la causa no se conoce. El dolor abdominal puede estar relacionado con alguna alteración del embarazo, o puede deberse a una causa totalmente diferente. Por este motivo, siempre consulte a su médico cuando sienta molestias abdominales. °INSTRUCCIONES PARA EL CUIDADO EN EL HOGAR  °Esté atenta al dolor para ver si hay cambios. Las siguientes indicaciones ayudarán a aliviar cualquier molestia que pueda sentir: °· No tenga relaciones sexuales y no coloque nada dentro de la vagina hasta que los síntomas hayan desaparecido completamente. °· Descanse todo lo que pueda hasta que el dolor se le haya calmado. °· Si siente náuseas, beba líquidos claros. Evite los alimentos sólidos mientras sienta malestar o tenga náuseas. °· Tome sólo medicamentos de venta libre o recetados, según las indicaciones del médico. °· Cumpla con todas las visitas de control, según le indique su médico. °SOLICITE ATENCIÓN MÉDICA DE INMEDIATO SI: °· Tiene un sangrado, pérdida de líquidos o elimina tejidos por la vagina. °· El dolor o los cólicos aumentan. °· Tiene vómitos persistentes. °· Comienza a sentir dolor al orinar u observa sangre. °· Tiene fiebre. °· Nota que los movimientos del bebé disminuyen. °· Siente intensa debilidad o se marea. °· Tiene dificultad para respirar con o sin dolor abdominal. °· Siente un dolor de cabeza intenso junto al dolor abdominal. °· Tiene una secreción vaginal anormal con dolor abdominal. °· Tiene diarrea persistente. °· El dolor abdominal sigue o empeora aún después de hacer reposo. °ASEGÚRESE DE QUE:  °· Comprende estas  instrucciones. °· Controlará su afección. °· Recibirá ayuda de inmediato si no mejora o si empeora. °Esta información no tiene como fin reemplazar el consejo del médico. Asegúrese de hacerle al médico cualquier pregunta que tenga. °Document Released: 08/02/2005 Document Revised: 11/24/2015 °Elsevier Interactive Patient Education © 2017 Elsevier Inc. ° °

## 2018-04-12 LAB — GC/CHLAMYDIA PROBE AMP (~~LOC~~) NOT AT ARMC
Chlamydia: NEGATIVE
Neisseria Gonorrhea: NEGATIVE

## 2018-04-16 ENCOUNTER — Inpatient Hospital Stay (HOSPITAL_BASED_OUTPATIENT_CLINIC_OR_DEPARTMENT_OTHER): Payer: Medicaid Other

## 2018-04-16 ENCOUNTER — Other Ambulatory Visit: Payer: Self-pay

## 2018-04-16 ENCOUNTER — Encounter (HOSPITAL_COMMUNITY): Payer: Self-pay

## 2018-04-16 ENCOUNTER — Inpatient Hospital Stay (HOSPITAL_COMMUNITY)
Admission: AD | Admit: 2018-04-16 | Discharge: 2018-04-17 | Disposition: A | Discharge status: Nursing Home (Includes ICF) | Payer: Medicaid Other | Source: Ambulatory Visit | Attending: Obstetrics & Gynecology | Admitting: Obstetrics & Gynecology

## 2018-04-16 DIAGNOSIS — O4703 False labor before 37 completed weeks of gestation, third trimester: Secondary | ICD-10-CM

## 2018-04-16 DIAGNOSIS — Z833 Family history of diabetes mellitus: Secondary | ICD-10-CM | POA: Insufficient documentation

## 2018-04-16 DIAGNOSIS — Z3686 Encounter for antenatal screening for cervical length: Secondary | ICD-10-CM | POA: Diagnosis not present

## 2018-04-16 DIAGNOSIS — Z362 Encounter for other antenatal screening follow-up: Secondary | ICD-10-CM

## 2018-04-16 DIAGNOSIS — Z9889 Other specified postprocedural states: Secondary | ICD-10-CM | POA: Insufficient documentation

## 2018-04-16 DIAGNOSIS — O99613 Diseases of the digestive system complicating pregnancy, third trimester: Secondary | ICD-10-CM | POA: Diagnosis not present

## 2018-04-16 DIAGNOSIS — O26893 Other specified pregnancy related conditions, third trimester: Secondary | ICD-10-CM | POA: Diagnosis present

## 2018-04-16 DIAGNOSIS — O09213 Supervision of pregnancy with history of pre-term labor, third trimester: Secondary | ICD-10-CM

## 2018-04-16 DIAGNOSIS — O4693 Antepartum hemorrhage, unspecified, third trimester: Secondary | ICD-10-CM | POA: Diagnosis not present

## 2018-04-16 DIAGNOSIS — K432 Incisional hernia without obstruction or gangrene: Secondary | ICD-10-CM | POA: Diagnosis not present

## 2018-04-16 DIAGNOSIS — Z880 Allergy status to penicillin: Secondary | ICD-10-CM | POA: Insufficient documentation

## 2018-04-16 DIAGNOSIS — Z3A28 28 weeks gestation of pregnancy: Secondary | ICD-10-CM | POA: Diagnosis not present

## 2018-04-16 DIAGNOSIS — Z806 Family history of leukemia: Secondary | ICD-10-CM | POA: Diagnosis not present

## 2018-04-16 DIAGNOSIS — Z3A29 29 weeks gestation of pregnancy: Secondary | ICD-10-CM

## 2018-04-16 DIAGNOSIS — Z886 Allergy status to analgesic agent status: Secondary | ICD-10-CM | POA: Diagnosis not present

## 2018-04-16 DIAGNOSIS — Z79899 Other long term (current) drug therapy: Secondary | ICD-10-CM | POA: Insufficient documentation

## 2018-04-16 DIAGNOSIS — O3432 Maternal care for cervical incompetence, second trimester: Secondary | ICD-10-CM

## 2018-04-16 DIAGNOSIS — Z8059 Family history of malignant neoplasm of other urinary tract organ: Secondary | ICD-10-CM | POA: Diagnosis not present

## 2018-04-16 DIAGNOSIS — R109 Unspecified abdominal pain: Secondary | ICD-10-CM | POA: Insufficient documentation

## 2018-04-16 LAB — URINALYSIS, ROUTINE W REFLEX MICROSCOPIC
Bilirubin Urine: NEGATIVE
Glucose, UA: NEGATIVE mg/dL
Hgb urine dipstick: NEGATIVE
Ketones, ur: NEGATIVE mg/dL
Nitrite: NEGATIVE
Protein, ur: NEGATIVE mg/dL
Specific Gravity, Urine: 1.014 (ref 1.005–1.030)
pH: 7 (ref 5.0–8.0)

## 2018-04-16 LAB — CBC
HCT: 30.8 % — ABNORMAL LOW (ref 36.0–46.0)
Hemoglobin: 10.6 g/dL — ABNORMAL LOW (ref 12.0–15.0)
MCH: 31.5 pg (ref 26.0–34.0)
MCHC: 34.4 g/dL (ref 30.0–36.0)
MCV: 91.7 fL (ref 78.0–100.0)
Platelets: 124 10*3/uL — ABNORMAL LOW (ref 150–400)
RBC: 3.36 MIL/uL — ABNORMAL LOW (ref 3.87–5.11)
RDW: 13 % (ref 11.5–15.5)
WBC: 11.1 10*3/uL — ABNORMAL HIGH (ref 4.0–10.5)

## 2018-04-16 LAB — FETAL FIBRONECTIN: Fetal Fibronectin: NEGATIVE

## 2018-04-16 MED ORDER — PROMETHAZINE HCL 25 MG/ML IJ SOLN
12.5000 mg | Freq: Once | INTRAMUSCULAR | Status: AC
  Administered 2018-04-17: 12.5 mg via INTRAVENOUS
  Filled 2018-04-16: qty 1

## 2018-04-16 MED ORDER — NIFEDIPINE 10 MG PO CAPS
10.0000 mg | ORAL_CAPSULE | ORAL | Status: DC | PRN
  Administered 2018-04-16 – 2018-04-17 (×2): 10 mg via ORAL
  Filled 2018-04-16 (×2): qty 1

## 2018-04-16 MED ORDER — LACTATED RINGERS IV BOLUS
1000.0000 mL | Freq: Once | INTRAVENOUS | Status: AC
  Administered 2018-04-16: 1000 mL via INTRAVENOUS

## 2018-04-16 MED ORDER — FENTANYL CITRATE (PF) 100 MCG/2ML IJ SOLN
100.0000 ug | Freq: Once | INTRAMUSCULAR | Status: AC
Start: 2018-04-16 — End: 2018-04-16
  Administered 2018-04-16: 100 ug via INTRAVENOUS
  Filled 2018-04-16: qty 2

## 2018-04-16 NOTE — MAU Note (Signed)
abdominal pain 10 on pain scale since 2120 every 2 mins. No vaginal bleeding or SROM. Has hernia on right side near c-section scar.

## 2018-04-17 DIAGNOSIS — Z3686 Encounter for antenatal screening for cervical length: Secondary | ICD-10-CM

## 2018-04-17 DIAGNOSIS — Z3A28 28 weeks gestation of pregnancy: Secondary | ICD-10-CM

## 2018-04-17 DIAGNOSIS — Z98891 History of uterine scar from previous surgery: Secondary | ICD-10-CM

## 2018-04-17 DIAGNOSIS — O09213 Supervision of pregnancy with history of pre-term labor, third trimester: Secondary | ICD-10-CM

## 2018-04-17 DIAGNOSIS — O26893 Other specified pregnancy related conditions, third trimester: Secondary | ICD-10-CM

## 2018-04-17 DIAGNOSIS — O4703 False labor before 37 completed weeks of gestation, third trimester: Secondary | ICD-10-CM

## 2018-04-17 DIAGNOSIS — O3433 Maternal care for cervical incompetence, third trimester: Secondary | ICD-10-CM

## 2018-04-17 DIAGNOSIS — K432 Incisional hernia without obstruction or gangrene: Secondary | ICD-10-CM

## 2018-04-17 DIAGNOSIS — R109 Unspecified abdominal pain: Secondary | ICD-10-CM

## 2018-04-17 LAB — COMPREHENSIVE METABOLIC PANEL
ALT: 15 U/L (ref 0–44)
AST: 17 U/L (ref 15–41)
Albumin: 2.9 g/dL — ABNORMAL LOW (ref 3.5–5.0)
Alkaline Phosphatase: 68 U/L (ref 38–126)
Anion gap: 8 (ref 5–15)
BUN: 10 mg/dL (ref 6–20)
CO2: 20 mmol/L — ABNORMAL LOW (ref 22–32)
Calcium: 8.3 mg/dL — ABNORMAL LOW (ref 8.9–10.3)
Chloride: 108 mmol/L (ref 98–111)
Creatinine, Ser: 0.49 mg/dL (ref 0.44–1.00)
GFR calc Af Amer: >60 mL/min (ref >60)
GFR calc non Af Amer: >60 mL/min (ref >60)
Glucose, Bld: 89 mg/dL (ref 70–99)
Potassium: 3.5 mmol/L (ref 3.5–5.1)
Sodium: 136 mmol/L (ref 135–145)
Total Bilirubin: 0.5 mg/dL (ref 0.3–1.2)
Total Protein: 5.7 g/dL — ABNORMAL LOW (ref 6.5–8.1)

## 2018-04-17 LAB — TYPE AND SCREEN
ABO/RH(D): B POS
Antibody Screen: NEGATIVE

## 2018-04-17 LAB — PROTEIN / CREATININE RATIO, URINE
Creatinine, Urine: 92 mg/dL
Protein Creatinine Ratio: 0.13 mg/mg{Cre} (ref 0.00–0.15)
Total Protein, Urine: 12 mg/dL

## 2018-04-17 LAB — AMNISURE RUPTURE OF MEMBRANE (ROM) NOT AT ARMC: Amnisure ROM: NEGATIVE

## 2018-04-17 MED ORDER — HYDROMORPHONE HCL 1 MG/ML IJ SOLN
1.0000 mg | Freq: Once | INTRAMUSCULAR | Status: AC
  Administered 2018-04-17: 1 mg via INTRAVENOUS
  Filled 2018-04-17: qty 1

## 2018-04-17 MED ORDER — MAGNESIUM SULFATE 40 G IN LACTATED RINGERS - SIMPLE
2.0000 g/h | INTRAVENOUS | Status: DC
  Administered 2018-04-17: 2 g/h via INTRAVENOUS
  Filled 2018-04-17: qty 500

## 2018-04-17 MED ORDER — LACTATED RINGERS IV BOLUS
1000.0000 mL | Freq: Once | INTRAVENOUS | Status: AC
  Administered 2018-04-17: 1000 mL via INTRAVENOUS

## 2018-04-17 MED ORDER — GI COCKTAIL ~~LOC~~
30.0000 mL | Freq: Once | ORAL | Status: AC
  Administered 2018-04-17: 30 mL via ORAL
  Filled 2018-04-17: qty 30

## 2018-04-17 MED ORDER — BETAMETHASONE SOD PHOS & ACET 6 (3-3) MG/ML IJ SUSP
12.0000 mg | Freq: Once | INTRAMUSCULAR | Status: AC
  Administered 2018-04-17: 12 mg via INTRAMUSCULAR
  Filled 2018-04-17: qty 2

## 2018-04-17 MED ORDER — MAGNESIUM SULFATE BOLUS VIA INFUSION
4.0000 g | Freq: Once | INTRAVENOUS | Status: AC
  Administered 2018-04-17: 4 g via INTRAVENOUS
  Filled 2018-04-17: qty 500

## 2018-04-17 MED ORDER — FENTANYL CITRATE (PF) 100 MCG/2ML IJ SOLN
100.0000 ug | Freq: Once | INTRAMUSCULAR | Status: AC
  Administered 2018-04-17: 100 ug via INTRAVENOUS
  Filled 2018-04-17: qty 2

## 2018-04-17 MED ORDER — TERBUTALINE SULFATE 1 MG/ML IJ SOLN
0.2500 mg | Freq: Once | INTRAMUSCULAR | Status: AC
  Administered 2018-04-17: 0.25 mg via SUBCUTANEOUS
  Filled 2018-04-17: qty 1

## 2018-04-17 NOTE — MAU Provider Note (Signed)
History     CSN: [id number]  Arrival date and time: 04/16/18 2138   First Provider Initiated Contact with Patient 04/16/18 2233      Chief Complaint  Patient presents with  . Abdominal Pain   [name] is a 37 y.o. G3P2 at [redacted]w[redacted]d who presents to MAU with complaints of abdominal pain. She reports abdominal pain that started occurring one hour prior to arrival to MAU.  She reports pain is intermittent and occurs every 2-3 minutes, describes pain as aching and contractions. She reports painful contractions that she has to breath through and rates them 10/10. Reports abdominal pain radiates down to her hernia. Describes the pain at incisional hernia as sharp constant pain, rates 7/10- reports pain is continue to worsen. She reports abdominal pain is associated with nausea that started occurring last night around 2100. Hx of vertical C/S at 28 weeks with repeat C/S at 36 weeks. She reports +FM, denies vaginal discharge or vaginal bleeding. She was recently seen in MAU on 8/27, cervix was closed on examination.    OB History    Gravida  3   Para  2   Term  0   Preterm  2   AB  0   Living  2     SAB  0   TAB  0   Ectopic  0   Multiple  0   Live Births  2           Past Medical History:  Diagnosis Date  . Endometriosis in scar of skin   . PONV (postoperative nausea and vomiting)     Past Surgical History:  Procedure Laterality Date  . CESAREAN SECTION     X2  . LAPAROTOMY  11/15/2011   Procedure: EXPLORATORY LAPAROTOMY;  Surgeon: Marlane Hatcher, MD;  Location: [facility];  Service: General;  Laterality: N/A;  Exploration and Excision of Endometrial Implant  . MASS EXCISION N/A 04/09/2013   Procedure: EXCISION OF ENDOMETRIOMA;  Surgeon: Marlane Hatcher, MD;  Location: [facility];  Service: General;  Laterality: N/A;  . OVARIAN CYST REMOVAL  03/2016   3 surgeries to remove ovarian cysts    Family History  Problem Relation Age of Onset  . Diabetes Mother    . Cancer Mother        UTERINE   . Cancer Brother        LEUKEMIA  . Anesthesia problems Neg Hx   . Hypotension Neg Hx   . Malignant hyperthermia Neg Hx   . Pseudochol deficiency Neg Hx     Social History   Tobacco Use  . Smoking status: Never Smoker  . Smokeless tobacco: Never Used  Substance Use Topics  . Alcohol use: No    Alcohol/week: 0.0 standard drinks  . Drug use: No    Allergies:  Allergies  Allergen Reactions  . Penicillins Itching and Other (See Comments)    Causes dizziness and burning inside.  Has patient had a PCN reaction causing immediate rash, facial/tongue/throat swelling, SOB or lightheadedness with hypotension: No Has patient had a PCN reaction causing severe rash involving mucus membranes or skin necrosis: No Has patient had a PCN reaction that required hospitalization No Has patient had a PCN reaction occurring within the last 10 years: Yes If all of the above answers are "NO", then may proceed with Cephalosporin use.   . Tylenol [Acetaminophen] Anxiety    Causes itching    Medications Prior to Admission  Medication Sig Dispense  Refill Last Dose  . Elastic Bandages & Supports (TRUFORM LITES MATERNITY HOSE) MISC Wear daily for support of lower leg varicose veins 1 each 0 Taking  . fluticasone (FLONASE) 50 MCG/ACT nasal spray Place 1 spray into both nostrils daily. 16 g 2 Past Week at Unknown time  . Prenatal Vit-Fe Fumarate-FA (PRENATAL MULTIVITAMIN) TABS tablet Take 1 tablet by mouth daily at 12 noon.   04/10/2018 at Unknown time    Review of Systems  Constitutional: Negative.   Respiratory: Negative.   Cardiovascular: Negative.   Gastrointestinal: Positive for abdominal pain and nausea. Negative for constipation, diarrhea and vomiting.  Genitourinary: Negative.   Musculoskeletal: Negative.   Neurological: Negative.    Physical Exam   Blood pressure (!) 111/56, pulse 93, temperature 98.1 F (36.7 C), temperature source Oral, resp. rate  20, height 5\' 7"  (1.702 m), weight 68 kg, last menstrual period 10/03/2017, SpO2 100 %.  Physical Exam  Nursing note and vitals reviewed. Constitutional: She is oriented to person, place, and time. She appears well-developed and well-nourished. No distress.  HENT:  Head: Normocephalic.  Cardiovascular: Normal rate, regular rhythm and normal heart sounds.  Respiratory: Effort normal. No respiratory distress. She has no wheezes.  GI: Soft. Bowel sounds are normal. There is tenderness. There is guarding.  6cm incisional hernia located on right aspect of C/S scar, tender to touch, reducible, non gangrene.   Musculoskeletal: Normal range of motion. She exhibits no edema.  Neurological: She is alert and oriented to person, place, and time.  Skin: Skin is warm and dry.  Psychiatric: She has a normal mood and affect. Her behavior is normal. Thought content normal.   CERVICAL EXAM: cervical change from 8/27, with bloody show noted on examination  Dilation: 1 Effacement (%): 40 Cervical Position: [place]: -3 Presentation: Vertex Exam by:: Steward Drone, CNM  MAU Course  Procedures  MDM Orders Placed This Encounter  Procedures  . Culture, OB Urine  . [place] [facility] OB FOLLOW UP  . Urinalysis, Routine w reflex microscopic  . Fetal fibronectin  . CBC  . Amnisure rupture of membrane (rom)not at [facility]  . Comprehensive metabolic panel  . Protein / creatinine ratio, urine  . Type and screen [facility]  . Insert peripheral IV   2320- IV fluid bolus, procardia series, fentanyl , labs ordered  CBC- platelets 124 otherwise normal  CMP- WNL  PCR- WNL  UA- moderate leukocytes, increased WBC with few bacteria- will treat for UTI, urine culture pending   0000- [place] [facility] OB Transvaginal and [place] [facility] OB Follow up for fetal growth  0030- call to [place] bedside by radiologist for patient reports possible leaking of fluid, pink discharge seen on washcloth and sheet- amnisure  collected Amnisure- negative   0100- Sterile speculum examination performed, negative pooling of fluid, fern negative. Cervical examination unchanged, bloody show noted on vaginal examination   0145- Phenergan,  terbutaline ordered, Fentanyl given for continued abdominal pain of 10/10 0230- IV LR bolus   0445- patient reports contractions have resolved but she now has epigastric pain that radiates down to her hernia, rates pain 7/10. GI Cocktail ordered   0540- Dr Debroah Loop at bedside to assess and evaluate patient, dilaudid and magnesium ordered. Transfer of patient initiated. Betamethasone given prior to transfer  0620- Report given to Dr Glenice Laine at [facility]   [facility] at MAU to transport patient  Assessment and Plan   1. Preterm uterine contractions  in third trimester, antepartum   2. Premature cervical dilation, second trimester   3. Encounter for antenatal screening for cervical length   4. [redacted] weeks gestation of pregnancy   5. Abdominal pain during pregnancy in third trimester   6. Incisional hernia, without obstruction or gangrene    Transfer to [facility] L&D  Betamethasone IM Magnesium 4g bolus/2g load  Dilaudid 1mg  IM    Sharyon Cable CNM 04/17/2018, 8:03 AM

## 2018-04-18 LAB — CULTURE, OB URINE

## 2018-04-20 ENCOUNTER — Other Ambulatory Visit: Payer: Self-pay | Admitting: General Practice

## 2018-04-20 ENCOUNTER — Encounter: Payer: Self-pay | Admitting: Obstetrics and Gynecology

## 2018-04-20 ENCOUNTER — Other Ambulatory Visit: Payer: Self-pay

## 2018-04-20 DIAGNOSIS — O0990 Supervision of high risk pregnancy, unspecified, unspecified trimester: Secondary | ICD-10-CM

## 2018-04-26 ENCOUNTER — Telehealth: Payer: Self-pay | Admitting: *Deleted

## 2018-04-26 NOTE — Telephone Encounter (Signed)
Received a call from a nurse navigator at [facility] that it was decided after patient discharged from [facility] that patient will receive her remained prenatal care at [facility] in [place] because of her surgery.

## 2018-05-04 ENCOUNTER — Encounter: Payer: Self-pay | Admitting: Family Medicine

## 2018-05-08 ENCOUNTER — Encounter: Payer: Self-pay | Admitting: *Deleted

## 2018-05-17 ENCOUNTER — Ambulatory Visit (HOSPITAL_COMMUNITY): Payer: Self-pay

## 2018-10-02 ENCOUNTER — Encounter (HOSPITAL_COMMUNITY): Payer: Self-pay

## 2020-07-04 ENCOUNTER — Ambulatory Visit: Payer: Self-pay | Admitting: Internal Medicine

## 2020-07-07 ENCOUNTER — Other Ambulatory Visit: Payer: Self-pay

## 2020-07-07 ENCOUNTER — Encounter (HOSPITAL_COMMUNITY): Payer: Self-pay | Admitting: Emergency Medicine

## 2020-07-07 ENCOUNTER — Emergency Department (HOSPITAL_COMMUNITY)
Admission: EM | Admit: 2020-07-07 | Discharge: 2020-07-07 | Disposition: A | Discharge status: Home / Self Care | Payer: Self-pay | Attending: Emergency Medicine | Admitting: Emergency Medicine

## 2020-07-07 DIAGNOSIS — Z5321 Procedure and treatment not carried out due to patient leaving prior to being seen by health care provider: Secondary | ICD-10-CM | POA: Insufficient documentation

## 2020-07-07 DIAGNOSIS — R1030 Lower abdominal pain, unspecified: Secondary | ICD-10-CM | POA: Insufficient documentation

## 2020-07-07 LAB — COMPREHENSIVE METABOLIC PANEL
ALT: 13 U/L (ref 0–44)
AST: 14 U/L — ABNORMAL LOW (ref 15–41)
Albumin: 3.9 g/dL (ref 3.5–5.0)
Alkaline Phosphatase: 47 U/L (ref 38–126)
Anion gap: 8 (ref 5–15)
BUN: 11 mg/dL (ref 6–20)
CO2: 21 mmol/L — ABNORMAL LOW (ref 22–32)
Calcium: 8.7 mg/dL — ABNORMAL LOW (ref 8.9–10.3)
Chloride: 107 mmol/L (ref 98–111)
Creatinine, Ser: 0.53 mg/dL (ref 0.44–1.00)
GFR, Estimated: >60 mL/min (ref >60)
Glucose, Bld: 90 mg/dL (ref 70–99)
Potassium: 4 mmol/L (ref 3.5–5.1)
Sodium: 136 mmol/L (ref 135–145)
Total Bilirubin: 0.7 mg/dL (ref 0.3–1.2)
Total Protein: 6.7 g/dL (ref 6.5–8.1)

## 2020-07-07 LAB — URINALYSIS, ROUTINE W REFLEX MICROSCOPIC
Bilirubin Urine: NEGATIVE
Glucose, UA: NEGATIVE mg/dL
Hgb urine dipstick: NEGATIVE
Ketones, ur: 5 mg/dL — AB
Leukocytes,Ua: NEGATIVE
Nitrite: NEGATIVE
Protein, ur: NEGATIVE mg/dL
Specific Gravity, Urine: 1.024 (ref 1.005–1.030)
pH: 5 (ref 5.0–8.0)

## 2020-07-07 LAB — CBC
HCT: 36.6 % (ref 36.0–46.0)
Hemoglobin: 11.8 g/dL — ABNORMAL LOW (ref 12.0–15.0)
MCH: 28 pg (ref 26.0–34.0)
MCHC: 32.2 g/dL (ref 30.0–36.0)
MCV: 86.9 fL (ref 80.0–100.0)
Platelets: 183 10*3/uL (ref 150–400)
RBC: 4.21 MIL/uL (ref 3.87–5.11)
RDW: 12.8 % (ref 11.5–15.5)
WBC: 5.8 10*3/uL (ref 4.0–10.5)
nRBC: 0 % (ref 0.0–0.2)

## 2020-07-07 LAB — I-STAT BETA HCG BLOOD, ED (MC, WL, AP ONLY): I-stat hCG, quantitative: <5 m[IU]/mL (ref <5)

## 2020-07-07 LAB — LIPASE, BLOOD: Lipase: 26 U/L (ref 11–51)

## 2020-07-07 NOTE — ED Triage Notes (Signed)
Pt reports lower abd pain under her c section scar, c section 2 years ago. Denies any associated symptoms.

## 2020-07-07 NOTE — ED Notes (Signed)
Left due to wait time

## 2020-09-22 ENCOUNTER — Other Ambulatory Visit: Payer: Self-pay

## 2020-09-22 ENCOUNTER — Ambulatory Visit: Payer: Self-pay | Admitting: Nurse Practitioner

## 2020-12-10 ENCOUNTER — Ambulatory Visit: Payer: Self-pay | Attending: Internal Medicine | Admitting: Nurse Practitioner

## 2020-12-10 ENCOUNTER — Other Ambulatory Visit: Payer: Self-pay

## 2020-12-10 ENCOUNTER — Encounter: Payer: Self-pay | Admitting: Nurse Practitioner

## 2020-12-10 DIAGNOSIS — Z7689 Persons encountering health services in other specified circumstances: Secondary | ICD-10-CM

## 2020-12-10 NOTE — Progress Notes (Signed)
Virtual Visit via Telephone Note Due to national recommendations of social distancing due to COVID 19, telehealth visit is felt to be most appropriate for this patient at this time.  I discussed the limitations, risks, security and privacy concerns of performing an evaluation and management service by telephone and the availability of in person appointments. I also discussed with the patient that there may be a patient responsible charge related to this service. The patient expressed understanding and agreed to proceed.    I connected with [name] on 12/10/20  at   8:50 AM EDT  EDT by telephone and verified that I am speaking with the correct person using two identifiers.   Consent I discussed the limitations, risks, security and privacy concerns of performing an evaluation and management service by telephone and the availability of in person appointments. I also discussed with the patient that there may be a patient responsible charge related to this service. The patient expressed understanding and agreed to proceed.   Location of Patient: Private Residence   Location of Provider: [facility] [facility]    Persons participating in Telemedicine visit: Bertram Denver FNP-BC [facility] CMA Carlena Ruybal  Spanish interpreter ID [phone]   History of Present Illness: Telemedicine visit for: Establish care She does not have any significant PMH.  Patient has been counseled on age-appropriate routine health concerns for screening and prevention. These are reviewed and up-to-date. Referrals have been placed accordingly. Immunizations are up-to-date or declined.    PAP SMEAR: Schedule for next visit. Due in May.    Denies chest pain, shortness of breath, palpitations, lightheadedness, dizziness, headaches or BLE edema. She denies any PMH of DM, Thyroid disorder, HPL or autoimmune diseases.      Past Medical History:  Diagnosis Date  . Endometriosis in  scar of skin   . PONV (postoperative nausea and vomiting)     Past Surgical History:  Procedure Laterality Date  . CESAREAN SECTION     X2  . LAPAROTOMY  11/15/2011   Procedure: EXPLORATORY LAPAROTOMY;  Surgeon: Marlane Hatcher, MD;  Location: [facility];  Service: General;  Laterality: N/A;  Exploration and Excision of Endometrial Implant  . MASS EXCISION N/A 04/09/2013   Procedure: EXCISION OF ENDOMETRIOMA;  Surgeon: Marlane Hatcher, MD;  Location: [facility];  Service: General;  Laterality: N/A;  . OVARIAN CYST REMOVAL  03/2016   3 surgeries to remove ovarian cysts    Family History  Problem Relation Age of Onset  . Diabetes Mother   . Cancer Mother        UTERINE   . Cancer Brother        LEUKEMIA  . Anesthesia problems Neg Hx   . Hypotension Neg Hx   . Malignant hyperthermia Neg Hx   . Pseudochol deficiency Neg Hx     Social History   Socioeconomic History  . Marital status: Married    Spouse name: Not on file  . Number of children: Not on file  . Years of education: Not on file  . Highest education level: Not on file  Occupational History  . Not on file  Tobacco Use  . Smoking status: Never Smoker  . Smokeless tobacco: Never Used  Substance and Sexual Activity  . Alcohol use: No    Alcohol/week: 0.0 standard drinks  . Drug use: No  . Sexual activity: Yes    Birth control/protection: None  Other Topics Concern  . Not on file  Social History  Narrative  . Not on file   Social Determinants of Health   Financial Resource Strain: Not on file  Food Insecurity: Not on file  Transportation Needs: Not on file  Physical Activity: Not on file  Stress: Not on file  Social Connections: Not on file     Observations/Objective: Awake, alert and oriented x 3   Review of Systems  Constitutional: Negative for fever, malaise/fatigue and weight loss.  HENT: Negative.  Negative for nosebleeds.   Eyes: Negative.  Negative for blurred vision, double vision and photophobia.   Respiratory: Negative.  Negative for cough and shortness of breath.   Cardiovascular: Negative.  Negative for chest pain, palpitations and leg swelling.  Gastrointestinal: Negative.  Negative for heartburn, nausea and vomiting.  Musculoskeletal: Negative.  Negative for myalgias.  Neurological: Negative.  Negative for dizziness, focal weakness, seizures and headaches.  Psychiatric/Behavioral: Negative.  Negative for suicidal ideas.    Assessment and Plan: [name] was seen today for establish care.  Diagnoses and all orders for this visit:  Encounter to establish care Schedule PAP smear   Follow Up Instructions Return in about 2 months (around 02/23/2021) for Physical and PAP.     I discussed the assessment and treatment plan with the patient. The patient was provided an opportunity to ask questions and all were answered. The patient agreed with the plan and demonstrated an understanding of the instructions.   The patient was advised to call back or seek an in-person evaluation if the symptoms worsen or if the condition fails to improve as anticipated.  I provided 10 minutes of non-face-to-face time during this encounter including median intraservice time, reviewing previous notes, labs, imaging, medications and explaining diagnosis and management.  Claiborne Rigg, FNP-BC

## 2021-02-23 ENCOUNTER — Ambulatory Visit: Payer: Self-pay | Admitting: Nurse Practitioner

## 2021-03-23 ENCOUNTER — Other Ambulatory Visit: Payer: Self-pay

## 2021-03-23 ENCOUNTER — Encounter: Payer: Self-pay | Admitting: Nurse Practitioner

## 2021-03-23 ENCOUNTER — Other Ambulatory Visit (HOSPITAL_COMMUNITY)
Admission: RE | Admit: 2021-03-23 | Discharge: 2021-03-23 | Disposition: A | Discharge status: Home / Self Care | Payer: Self-pay | Source: Ambulatory Visit | Attending: Nurse Practitioner | Admitting: Nurse Practitioner

## 2021-03-23 ENCOUNTER — Ambulatory Visit: Payer: Self-pay | Attending: Nurse Practitioner | Admitting: Nurse Practitioner

## 2021-03-23 VITALS — BP 106/62 | HR 82 | Ht 67.0 in | Wt 131.0 lb

## 2021-03-23 DIAGNOSIS — E871 Hypo-osmolality and hyponatremia: Secondary | ICD-10-CM

## 2021-03-23 DIAGNOSIS — D649 Anemia, unspecified: Secondary | ICD-10-CM

## 2021-03-23 DIAGNOSIS — Z124 Encounter for screening for malignant neoplasm of cervix: Secondary | ICD-10-CM | POA: Insufficient documentation

## 2021-03-23 DIAGNOSIS — Z1159 Encounter for screening for other viral diseases: Secondary | ICD-10-CM

## 2021-03-23 DIAGNOSIS — E785 Hyperlipidemia, unspecified: Secondary | ICD-10-CM

## 2021-03-23 DIAGNOSIS — Z114 Encounter for screening for human immunodeficiency virus [HIV]: Secondary | ICD-10-CM

## 2021-03-23 NOTE — Progress Notes (Signed)
Assessment & Plan:  [name] was seen today for gynecologic exam.  Diagnoses and all orders for this visit:  Encounter for Papanicolaou smear for cervical cancer screening -     Cervicovaginal ancillary only -     Cytology - PAP  Anemia, unspecified type -     CBC  Hyponatremia -     CMP14+EGFR  Need for hepatitis C screening test -     HCV Ab w Reflex to Quant PCR  Dyslipidemia, goal LDL below 100 -     Lipid panel  Encounter for screening for HIV -     HIV antibody (with reflex)    Patient has been counseled on age-appropriate routine health concerns for screening and prevention. These are reviewed and up-to-date. Referrals have been placed accordingly. Immunizations are up-to-date or declined.    Subjective:   Chief Complaint  Patient presents with   Gynecologic Exam   HPI [name] 40 y.o. female presents to office today for well woman exam.   Review of Systems  Constitutional:  Negative for fever, malaise/fatigue and weight loss.  HENT: Negative.  Negative for nosebleeds.   Eyes: Negative.  Negative for blurred vision, double vision and photophobia.  Respiratory: Negative.  Negative for cough and shortness of breath.   Cardiovascular: Negative.  Negative for chest pain, palpitations and leg swelling.  Gastrointestinal: Negative.  Negative for heartburn, nausea and vomiting.  Musculoskeletal: Negative.  Negative for myalgias.  Neurological: Negative.  Negative for dizziness, focal weakness, seizures and headaches.  Psychiatric/Behavioral: Negative.  Negative for suicidal ideas.    Past Medical History:  Diagnosis Date   Endometriosis in scar of skin    PONV (postoperative nausea and vomiting)     Past Surgical History:  Procedure Laterality Date   CESAREAN SECTION     X2   LAPAROTOMY  11/15/2011   Procedure: EXPLORATORY LAPAROTOMY;  Surgeon: Scherry Ran, MD;  Location: [facility];  Service: General;  Laterality: N/A;  Exploration and Excision  of Endometrial Implant   MASS EXCISION N/A 04/09/2013   Procedure: EXCISION OF ENDOMETRIOMA;  Surgeon: Scherry Ran, MD;  Location: [facility];  Service: General;  Laterality: N/A;   OVARIAN CYST REMOVAL  03/2016   3 surgeries to remove ovarian cysts    Family History  Problem Relation Age of Onset   Diabetes Mother    Cancer Mother        UTERINE    Cancer Brother        LEUKEMIA   Anesthesia problems Neg Hx    Hypotension Neg Hx    Malignant hyperthermia Neg Hx    Pseudochol deficiency Neg Hx     Social History Reviewed with no changes to be made today.   Outpatient Medications Prior to Visit  Medication Sig Dispense Refill   fluticasone (FLONASE) 50 MCG/ACT nasal spray Place 1 spray into both nostrils daily. 16 g 2   No facility-administered medications prior to visit.    Allergies  Allergen Reactions   Penicillins Itching and Other (See Comments)    Causes dizziness and burning inside.  Has patient had a PCN reaction causing immediate rash, facial/tongue/throat swelling, SOB or lightheadedness with hypotension: No Has patient had a PCN reaction causing severe rash involving mucus membranes or skin necrosis: No Has patient had a PCN reaction that required hospitalization No Has patient had a PCN reaction occurring within the last 10 years: Yes If all of the above answers are "NO", then may proceed  with Cephalosporin use.    Tylenol [Acetaminophen] Anxiety    Causes itching       Objective:    There were no vitals taken for this visit. Wt Readings from Last 3 Encounters:  04/16/18 150 lb (68 kg)  04/11/18 150 lb (68 kg)  03/22/18 144 lb (65.3 kg)    Physical Exam Exam conducted with a chaperone present.  Constitutional:      Appearance: She is well-developed.  HENT:     Head: Normocephalic.  Cardiovascular:     Rate and Rhythm: Normal rate and regular rhythm.     Heart sounds: Normal heart sounds.  Pulmonary:     Effort: Pulmonary effort is normal.      Breath sounds: Normal breath sounds.  Chest:     Chest wall: No mass.  Breasts:    Right: Absent. No axillary adenopathy or supraclavicular adenopathy.     Left: Absent. No axillary adenopathy or supraclavicular adenopathy.  Abdominal:     General: Bowel sounds are normal.     Palpations: Abdomen is soft.     Tenderness: There is abdominal tenderness in the periumbilical area and suprapubic area.     Hernia: There is no hernia in the left inguinal area.    Genitourinary:    Exam position: Lithotomy position.     Labia:        Right: No rash, tenderness, lesion or injury.        Left: No rash, tenderness, lesion or injury.      Vagina: Normal. No signs of injury and foreign body. No vaginal discharge, erythema, tenderness or bleeding.     Cervix: No cervical motion tenderness or friability.     Uterus: Not deviated and not enlarged.      Adnexa:        Right: No mass, tenderness or fullness.         Left: No mass, tenderness or fullness.       Rectum: Normal. No external hemorrhoid.  Lymphadenopathy:     Upper Body:     Right upper body: No supraclavicular, axillary or pectoral adenopathy.     Left upper body: No supraclavicular, axillary or pectoral adenopathy.     Lower Body: No right inguinal adenopathy. No left inguinal adenopathy.  Skin:    General: Skin is warm and dry.  Neurological:     Mental Status: She is alert and oriented to person, place, and time.  Psychiatric:        Behavior: Behavior normal.        Thought Content: Thought content normal.        Judgment: Judgment normal.         Patient has been counseled extensively about nutrition and exercise as well as the importance of adherence with medications and regular follow-up. The patient was given clear instructions to go to ER or return to medical center if symptoms don't improve, worsen or new problems develop. The patient verbalized understanding.   Follow-up: Return if symptoms worsen or fail to  improve.   Gildardo Pounds, FNP-BC [facility] [place]   03/23/2021, 2:33 PM

## 2021-03-24 ENCOUNTER — Encounter: Payer: Self-pay | Admitting: Nurse Practitioner

## 2021-03-24 LAB — CMP14+EGFR
ALT: 9 IU/L (ref 0–32)
AST: 10 IU/L (ref 0–40)
Albumin/Globulin Ratio: 1.8 (ref 1.2–2.2)
Albumin: 4.6 g/dL (ref 3.8–4.8)
Alkaline Phosphatase: 59 IU/L (ref 44–121)
BUN/Creatinine Ratio: 25 — ABNORMAL HIGH (ref 9–23)
BUN: 16 mg/dL (ref 6–20)
Bilirubin Total: 0.2 mg/dL (ref 0.0–1.2)
CO2: 22 mmol/L (ref 20–29)
Calcium: 9 mg/dL (ref 8.7–10.2)
Chloride: 107 mmol/L — ABNORMAL HIGH (ref 96–106)
Creatinine, Ser: 0.65 mg/dL (ref 0.57–1.00)
Globulin, Total: 2.5 g/dL (ref 1.5–4.5)
Glucose: 85 mg/dL (ref 65–99)
Potassium: 4.5 mmol/L (ref 3.5–5.2)
Sodium: 140 mmol/L (ref 134–144)
Total Protein: 7.1 g/dL (ref 6.0–8.5)
eGFR: 115 mL/min/{1.73_m2} (ref >59)

## 2021-03-24 LAB — LIPID PANEL
Chol/HDL Ratio: 3 ratio (ref 0.0–4.4)
Cholesterol, Total: 153 mg/dL (ref 100–199)
HDL: 51 mg/dL (ref >39)
LDL Chol Calc (NIH): 86 mg/dL (ref 0–99)
Triglycerides: 83 mg/dL (ref 0–149)
VLDL Cholesterol Cal: 16 mg/dL (ref 5–40)

## 2021-03-24 LAB — CBC
Hematocrit: 35.6 % (ref 34.0–46.6)
Hemoglobin: 11.7 g/dL (ref 11.1–15.9)
MCH: 28.5 pg (ref 26.6–33.0)
MCHC: 32.9 g/dL (ref 31.5–35.7)
MCV: 87 fL (ref 79–97)
Platelets: 185 10*3/uL (ref 150–450)
RBC: 4.1 x10E6/uL (ref 3.77–5.28)
RDW: 12.9 % (ref 11.7–15.4)
WBC: 5.6 10*3/uL (ref 3.4–10.8)

## 2021-03-24 LAB — CERVICOVAGINAL ANCILLARY ONLY
Bacterial Vaginitis (gardnerella): NEGATIVE
Candida Glabrata: NEGATIVE
Candida Vaginitis: NEGATIVE
Chlamydia: NEGATIVE
Comment: NEGATIVE
Comment: NEGATIVE
Comment: NEGATIVE
Comment: NEGATIVE
Comment: NEGATIVE
Comment: NORMAL
Neisseria Gonorrhea: NEGATIVE
Trichomonas: NEGATIVE

## 2021-03-24 LAB — HCV INTERPRETATION

## 2021-03-24 LAB — HIV ANTIBODY (ROUTINE TESTING W REFLEX): HIV Screen 4th Generation wRfx: NONREACTIVE

## 2021-03-24 LAB — HCV AB W REFLEX TO QUANT PCR: HCV Ab: <0.1 s/co ratio (ref 0.0–0.9)

## 2021-03-25 LAB — CYTOLOGY - PAP
Comment: NEGATIVE
Diagnosis: NEGATIVE
Diagnosis: REACTIVE
High risk HPV: NEGATIVE

## 2021-09-07 ENCOUNTER — Ambulatory Visit: Payer: Self-pay

## 2021-09-07 NOTE — Telephone Encounter (Addendum)
Used Spanish interpreter # [phone] [name]. Chief Complaint: pain with urination and itching Symptoms: burning Frequency: Since Saturday Pertinent Negatives: Patient denies fever, back pain, full bladder Disposition: [] ED /[] Urgent Care (no appt availability in office) / [] Appointment(In office/virtual)/ []  [facility]/ [] Home Care/ [] Refused Recommended Disposition /[x] [facility]/ []  Follow-up with PCP Additional Notes: On menses  Advised the mobile bus but pt refused and is going to a "clinic"      Reason for Disposition  All other urine symptoms  Answer Assessment - Initial Assessment Questions 1. SYMPTOM: "What's the main symptom you're concerned about?" (e.g., frequency, incontinence)     Pain with urination and itching 2. ONSET: "When did the  sx  start?"     Sat 3. PAIN: "Is there any pain?" If Yes, ask: "How bad is it?" (Scale: 1-10; mild, moderate, severe)     burning 4. CAUSE: "What do you think is causing the symptoms?"     uti 5. OTHER SYMPTOMS: "Do you have any other symptoms?" (e.g., fever, flank pain, blood in urine, pain with urination)     Pain with urination, on menses 6. PREGNANCY: "Is there any chance you are pregnant?" "When was your last menstrual period?"     *No Answer*  Protocols used: Urinary Symptoms-A-AH

## 2021-09-07 NOTE — Telephone Encounter (Signed)
Pt has burning with urination and itching. Pt requesting appt, but no appts at [facility].  You will need interpreter services.   Voice mailbox has not been set up, unable to leave a message.

## 2022-02-08 ENCOUNTER — Encounter (HOSPITAL_COMMUNITY): Payer: Self-pay

## 2022-02-08 ENCOUNTER — Other Ambulatory Visit: Payer: Self-pay

## 2022-02-08 ENCOUNTER — Emergency Department (HOSPITAL_COMMUNITY): Payer: Self-pay

## 2022-02-08 ENCOUNTER — Emergency Department (HOSPITAL_COMMUNITY)
Admission: EM | Admit: 2022-02-08 | Discharge: 2022-02-08 | Disposition: A | Discharge status: Home / Self Care | Payer: Self-pay | Attending: Student | Admitting: Student

## 2022-02-08 DIAGNOSIS — R1031 Right lower quadrant pain: Secondary | ICD-10-CM | POA: Insufficient documentation

## 2022-02-08 DIAGNOSIS — K5900 Constipation, unspecified: Secondary | ICD-10-CM

## 2022-02-08 LAB — COMPREHENSIVE METABOLIC PANEL
ALT: 13 U/L (ref 0–44)
AST: 14 U/L — ABNORMAL LOW (ref 15–41)
Albumin: 4.2 g/dL (ref 3.5–5.0)
Alkaline Phosphatase: 48 U/L (ref 38–126)
Anion gap: 6 (ref 5–15)
BUN: 13 mg/dL (ref 6–20)
CO2: 21 mmol/L — ABNORMAL LOW (ref 22–32)
Calcium: 8.7 mg/dL — ABNORMAL LOW (ref 8.9–10.3)
Chloride: 112 mmol/L — ABNORMAL HIGH (ref 98–111)
Creatinine, Ser: 0.58 mg/dL (ref 0.44–1.00)
GFR, Estimated: >60 mL/min (ref >60)
Glucose, Bld: 136 mg/dL — ABNORMAL HIGH (ref 70–99)
Potassium: 3.7 mmol/L (ref 3.5–5.1)
Sodium: 139 mmol/L (ref 135–145)
Total Bilirubin: 0.6 mg/dL (ref 0.3–1.2)
Total Protein: 6.9 g/dL (ref 6.5–8.1)

## 2022-02-08 LAB — URINALYSIS, ROUTINE W REFLEX MICROSCOPIC
Bilirubin Urine: NEGATIVE
Glucose, UA: NEGATIVE mg/dL
Hgb urine dipstick: NEGATIVE
Ketones, ur: NEGATIVE mg/dL
Nitrite: NEGATIVE
Protein, ur: NEGATIVE mg/dL
Specific Gravity, Urine: 1.023 (ref 1.005–1.030)
pH: 6 (ref 5.0–8.0)

## 2022-02-08 LAB — CBC WITH DIFFERENTIAL/PLATELET
Abs Immature Granulocytes: 0.01 10*3/uL (ref 0.00–0.07)
Basophils Absolute: 0 10*3/uL (ref 0.0–0.1)
Basophils Relative: 0 %
Eosinophils Absolute: 0.1 10*3/uL (ref 0.0–0.5)
Eosinophils Relative: 2 %
HCT: 34.2 % — ABNORMAL LOW (ref 36.0–46.0)
Hemoglobin: 11.6 g/dL — ABNORMAL LOW (ref 12.0–15.0)
Immature Granulocytes: 0 %
Lymphocytes Relative: 21 %
Lymphs Abs: 1.2 10*3/uL (ref 0.7–4.0)
MCH: 29.4 pg (ref 26.0–34.0)
MCHC: 33.9 g/dL (ref 30.0–36.0)
MCV: 86.6 fL (ref 80.0–100.0)
Monocytes Absolute: 0.3 10*3/uL (ref 0.1–1.0)
Monocytes Relative: 4 %
Neutro Abs: 4.1 10*3/uL (ref 1.7–7.7)
Neutrophils Relative %: 73 %
Platelets: 169 10*3/uL (ref 150–400)
RBC: 3.95 MIL/uL (ref 3.87–5.11)
RDW: 12.5 % (ref 11.5–15.5)
WBC: 5.7 10*3/uL (ref 4.0–10.5)
nRBC: 0 % (ref 0.0–0.2)

## 2022-02-08 LAB — LIPASE, BLOOD: Lipase: 39 U/L (ref 11–51)

## 2022-02-08 LAB — I-STAT BETA HCG BLOOD, ED (MC, WL, AP ONLY): I-stat hCG, quantitative: <5 m[IU]/mL (ref <5)

## 2022-02-08 MED ORDER — POLYETHYLENE GLYCOL 3350 17 G PO PACK: 17.0000 g | PACK | Freq: Every day | ORAL | Status: DC

## 2022-02-08 MED ORDER — ONDANSETRON HCL 4 MG/2ML IJ SOLN
4.0000 mg | Freq: Once | INTRAMUSCULAR | Status: AC
  Administered 2022-02-08: 4 mg via INTRAVENOUS
  Filled 2022-02-08: qty 2

## 2022-02-08 MED ORDER — MORPHINE SULFATE (PF) 4 MG/ML IV SOLN
4.0000 mg | Freq: Once | INTRAVENOUS | Status: AC
  Administered 2022-02-08: 4 mg via INTRAVENOUS
  Filled 2022-02-08: qty 1

## 2022-02-08 MED ORDER — KETOROLAC TROMETHAMINE 15 MG/ML IJ SOLN
15.0000 mg | Freq: Once | INTRAMUSCULAR | Status: AC
  Administered 2022-02-08: 15 mg via INTRAVENOUS
  Filled 2022-02-08: qty 1

## 2022-02-08 MED ORDER — POLYETHYLENE GLYCOL 3350 17 G PO PACK: 17.0000 g | PACK | Freq: Every day | ORAL | 0 refills | Status: AC

## 2022-02-08 MED ORDER — IOHEXOL 300 MG/ML  SOLN
100.0000 mL | Freq: Once | INTRAMUSCULAR | Status: AC | PRN
  Administered 2022-02-08: 100 mL via INTRAVENOUS

## 2022-02-08 MED ORDER — DICYCLOMINE HCL 20 MG PO TABS: 20.0000 mg | ORAL_TABLET | Freq: Two times a day (BID) | ORAL | 0 refills | Status: AC

## 2022-02-08 MED ORDER — LACTULOSE 10 GM/15ML PO SOLN
30.0000 g | Freq: Once | ORAL | Status: AC
  Administered 2022-02-08: 30 g via ORAL
  Filled 2022-02-08: qty 60

## 2022-02-08 NOTE — ED Provider Notes (Signed)
[facility]-EMERGENCY DEPT Provider Note  CSN: [id number] Arrival date & time: 02/08/22 1841  Chief Complaint(s) Abdominal Pain  HPI [name] is a 40 y.o. female with PMH endometriosis, incisional hernia who presents to the emergency department for of right lower quadrant abdominal pain.  Patient states that right lower quadrant pain has been intermittent over the last 1 week but has been more constant today.  Went to see her primary care physician who transferred her to the emergency department for appendicitis rule out.  Denies chest pain, shortness of breath, headache, fever or other systemic symptoms.  Denies vaginal bleeding, dysuria.    Past Medical History Past Medical History:  Diagnosis Date   Endometriosis in scar of skin    PONV (postoperative nausea and vomiting)    Patient Active Problem List   Diagnosis Date Noted   Ventral hernia 02/23/2018   Incisional hernia without obstruction or gangrene 02/22/2018   Marginal insertion of umbilical cord affecting management of mother 02/15/2018   Supervision of high risk pregnancy, antepartum 12/26/2017   History of preterm delivery, currently pregnant 12/26/2017   History of cesarean section, classical 12/26/2017   AMA (advanced maternal age) multigravida 35+ 12/26/2017   Endometriosis in scar of skin 10/25/2013   Home Medication(s) Prior to Admission medications   Medication Sig Start Date End Date Taking? Authorizing Provider  dicyclomine (BENTYL) 20 MG tablet Take 1 tablet (20 mg total) by mouth 2 (two) times daily. 02/08/22  Yes Madden Piazza, MD  polyethylene glycol (MIRALAX) 17 g packet Take 17 g by mouth daily. 02/08/22  Yes Naleyah Ohlinger, MD  fluticasone (FLONASE) 50 MCG/ACT nasal spray Place 1 spray into both nostrils daily. 01/25/18 01/25/19  Adam Phenix, MD                                                                                                                                     Past Surgical History Past Surgical History:  Procedure Laterality Date   CESAREAN SECTION     X2   LAPAROTOMY  11/15/2011   Procedure: EXPLORATORY LAPAROTOMY;  Surgeon: Marlane Hatcher, MD;  Location: [facility];  Service: General;  Laterality: N/A;  Exploration and Excision of Endometrial Implant   MASS EXCISION N/A 04/09/2013   Procedure: EXCISION OF ENDOMETRIOMA;  Surgeon: Marlane Hatcher, MD;  Location: [facility];  Service: General;  Laterality: N/A;   OVARIAN CYST REMOVAL  03/2016   3 surgeries to remove ovarian cysts   Family History Family History  Problem Relation Age of Onset   Diabetes Mother    Cancer Mother        UTERINE    Cancer Brother        LEUKEMIA   Anesthesia problems Neg Hx    Hypotension Neg Hx    Malignant hyperthermia Neg Hx    Pseudochol deficiency Neg Hx     Social History Social  History   Tobacco Use   Smoking status: Never   Smokeless tobacco: Never  Vaping Use   Vaping Use: Never used  Substance Use Topics   Alcohol use: No    Alcohol/week: 0.0 standard drinks of alcohol   Drug use: No   Allergies Penicillins and Tylenol [acetaminophen]  Review of Systems Review of Systems  Gastrointestinal:  Positive for abdominal pain.    Physical Exam Vital Signs  I have reviewed the triage vital signs BP 103/70   Pulse 71   Temp 98.9 F (37.2 C)   Resp 18   Ht 5\' 6"  (1.676 m)   Wt 58.1 kg   LMP 01/23/2022 (Exact Date)   SpO2 98%   BMI 20.66 kg/m   Physical Exam Vitals and nursing note reviewed.  Constitutional:      General: She is not in acute distress.    Appearance: She is well-developed.  HENT:     Head: Normocephalic and atraumatic.  Eyes:     Conjunctiva/sclera: Conjunctivae normal.  Cardiovascular:     Rate and Rhythm: Normal rate and regular rhythm.     Heart sounds: No murmur heard. Pulmonary:     Effort: Pulmonary effort is normal. No respiratory distress.     Breath sounds: Normal breath sounds.  Abdominal:      Palpations: Abdomen is soft.     Tenderness: There is abdominal tenderness in the right lower quadrant.  Musculoskeletal:        General: No swelling.     Cervical back: Neck supple.  Skin:    General: Skin is warm and dry.     Capillary Refill: Capillary refill takes less than 2 seconds.  Neurological:     Mental Status: She is alert.  Psychiatric:        Mood and Affect: Mood normal.     ED Results and Treatments Labs (all labs ordered are listed, but only abnormal results are displayed) Labs Reviewed  CBC WITH DIFFERENTIAL/PLATELET - Abnormal; Notable for the following components:      Result Value   Hemoglobin 11.6 (*)    HCT 34.2 (*)    All other components within normal limits  COMPREHENSIVE METABOLIC PANEL - Abnormal; Notable for the following components:   Chloride 112 (*)    CO2 21 (*)    Glucose, Bld 136 (*)    Calcium 8.7 (*)    AST 14 (*)    All other components within normal limits  URINALYSIS, ROUTINE W REFLEX MICROSCOPIC - Abnormal; Notable for the following components:   APPearance HAZY (*)    Leukocytes,Ua MODERATE (*)    Bacteria, UA RARE (*)    All other components within normal limits  LIPASE, BLOOD  I-STAT BETA HCG BLOOD, ED ([facility], [facility], [facility] ONLY)                                                                                                                          Radiology  CT Abdomen Pelvis W Contrast  Result Date: 02/08/2022 CLINICAL DATA:  Right lower quadrant pain EXAM: CT ABDOMEN AND PELVIS WITH CONTRAST TECHNIQUE: Multidetector CT imaging of the abdomen and pelvis was performed using the standard protocol following bolus administration of intravenous contrast. RADIATION DOSE REDUCTION: This exam was performed according to the departmental dose-optimization program which includes automated exposure control, adjustment of the mA and/or kV according to patient size and/or use of iterative reconstruction technique. CONTRAST:  OMNIPAQUE IOHEXOL 300  MG/ML  SOLN COMPARISON:  01/14/2016 FINDINGS: Lower chest: No acute abnormality Hepatobiliary: 7 mm cyst in the left hepatic lobe. No suspicious focal hepatic abnormality. Gallbladder unremarkable. Pancreas: No focal abnormality or ductal dilatation. Spleen: No focal abnormality.  Normal size. Adrenals/Urinary Tract: No adrenal abnormality. No focal renal abnormality. No stones or hydronephrosis. Urinary bladder is unremarkable. Stomach/Bowel: Large stool burden throughout the colon. Normal appendix. Large wide-mouth ventral wall hernia in the lower abdominal wall containing multiple small bowel loops and portion of the transverse colon. No evidence of bowel obstruction. Vascular/Lymphatic: No evidence of aneurysm or adenopathy. Reproductive: Uterus and adnexa unremarkable.  No mass. Other: No free fluid or free air. Musculoskeletal: No acute bony abnormality. IMPRESSION: Normal appendix. Large wide-mouth infraumbilical ventral hernia containing small bowel loops and transverse colon. No bowel obstruction. Large stool burden throughout the colon. Electronically Signed   By: Charlett Nose M.D.   On: 02/08/2022 21:09    Pertinent labs & imaging results that were available during my care of the patient were reviewed by me and considered in my medical decision making (see MDM for details).  Medications Ordered in ED Medications  ketorolac (TORADOL) 15 MG/ML injection 15 mg (15 mg Intravenous Given 02/08/22 2038)  morphine (PF) 4 MG/ML injection 4 mg (4 mg Intravenous Given 02/08/22 2038)  ondansetron (ZOFRAN) injection 4 mg (4 mg Intravenous Given 02/08/22 2038)  iohexol (OMNIPAQUE) 300 MG/ML solution 100 mL (100 mLs Intravenous Contrast Given 02/08/22 2100)  lactulose (CHRONULAC) 10 GM/15ML solution 30 g (30 g Oral Given 02/08/22 2210)                                                                                                                                     Procedures Procedures  (including critical  care time)  Medical Decision Making / ED Course   This patient presents to the ED for concern of right lower quadrant abdominal pain, this involves an extensive number of treatment options, and is a complaint that carries with it a high risk of complications and morbidity.  The differential diagnosis includes appendicitis, incarcerated hernia, constipation, cystitis, endometriosis  MDM: Patient seen emergency room for evaluation of right lower quadrant abdominal pain.  Physical exam reveals tenderness in the right lower quadrant but no appreciable guarding.  Laboratory evaluation with no leukocytosis, hemoglobin at baseline at 11.6, urinalysis with moderate leuk esterase but no white blood cells and 6-10 squamous  epithelial cells, lipase negative.  CT abdomen pelvis with large stool burden and no evidence of appendicitis.  Hernia visible but no evidence of incarceration.  Patient given pain control emergency department and on reevaluation her pain has subsided.  Suspect her pain is secondary to large stool burden seen on CT and today she was given a dose of lactulose prior to discharge and discharged with MiraLAX for bowel cleanout.  If pain not improved after bowel cleanout, she will follow-up with her PCP.  Bentyl also prescribed.   Additional history obtained: -Additional history obtained from husband -External records from outside source obtained and reviewed including: Chart review including previous notes, labs, imaging, consultation notes   Lab Tests: -I ordered, reviewed, and interpreted labs.   The pertinent results include:   Labs Reviewed  CBC WITH DIFFERENTIAL/PLATELET - Abnormal; Notable for the following components:      Result Value   Hemoglobin 11.6 (*)    HCT 34.2 (*)    All other components within normal limits  COMPREHENSIVE METABOLIC PANEL - Abnormal; Notable for the following components:   Chloride 112 (*)    CO2 21 (*)    Glucose, Bld 136 (*)    Calcium 8.7 (*)     AST 14 (*)    All other components within normal limits  URINALYSIS, ROUTINE W REFLEX MICROSCOPIC - Abnormal; Notable for the following components:   APPearance HAZY (*)    Leukocytes,Ua MODERATE (*)    Bacteria, UA RARE (*)    All other components within normal limits  LIPASE, BLOOD  I-STAT BETA HCG BLOOD, ED ([facility], [facility], [facility] ONLY)    Imaging Studies ordered: I ordered imaging studies including CT abdomen pelvis I independently visualized and interpreted imaging. I agree with the radiologist interpretation   Medicines ordered and prescription drug management: Meds ordered this encounter  Medications   ketorolac (TORADOL) 15 MG/ML injection 15 mg   morphine (PF) 4 MG/ML injection 4 mg   ondansetron (ZOFRAN) injection 4 mg   iohexol (OMNIPAQUE) 300 MG/ML solution 100 mL   DISCONTD: polyethylene glycol (MIRALAX / GLYCOLAX) packet 17 g   lactulose (CHRONULAC) 10 GM/15ML solution 30 g   polyethylene glycol (MIRALAX) 17 g packet    Sig: Take 17 g by mouth daily.    Dispense:  14 each    Refill:  0   dicyclomine (BENTYL) 20 MG tablet    Sig: Take 1 tablet (20 mg total) by mouth 2 (two) times daily.    Dispense:  20 tablet    Refill:  0    -I have reviewed the patients home medicines and have made adjustments as needed  Critical interventions none   Cardiac Monitoring: The patient was maintained on a cardiac monitor.  I personally viewed and interpreted the cardiac monitored which showed an underlying rhythm of: NSR  Social Determinants of Health:  Factors impacting patients care include: Spanish-speaking   Reevaluation: After the interventions noted above, I reevaluated the patient and found that they have :improved  Co morbidities that complicate the patient evaluation  Past Medical History:  Diagnosis Date   Endometriosis in scar of skin    PONV (postoperative nausea and vomiting)       Dispostion: I considered admission for this patient, but with overall negative  work-up, patient safer discharge with outpatient follow-up     Final Clinical Impression(s) / ED Diagnoses Final diagnoses:  None     @PCDICTATION @    Glendora Score, MD 02/09/22 0003

## 2024-02-03 ENCOUNTER — Ambulatory Visit: Payer: Self-pay

## 2024-02-03 NOTE — Telephone Encounter (Signed)
 FYI Only or Action Required?: FYI only for provider.  Patient was last seen in primary care on 03/23/2021 by Collins Dean, NP. Called Nurse Triage reporting Hernia. Symptoms began several days ago. Interventions attempted: Other: wearing her abdominal brace. Symptoms are: RLQ hernia with abdominal pain (tender to touch), nausea (resolved) gradually worsening.  Triage Disposition: Go to ED Now (Notify PCP)  Patient/caregiver understands and will follow disposition?: Yes                            Copied from CRM [id number]. Topic: Clinical - Red Word Triage >> Feb 03, 2024  3:25 PM Ethelle Herb L wrote: Red Word that prompted transfer to Nurse Triage:Pt staters she had a pain on tuesday and no longer has the pain but feels like her hernia has been poking out more since the pain came  [place], [place]: [id number] Reason for Disposition  Hernia is painful or tender to touch  Answer Assessment - Initial Assessment Questions Spanish interpreter Paint ID #[phone] on the phone for triage. Patient states on Tuesday she had a pain in the area of her hernia and  noticed her hernia is poking out more than usual.    1. ONSET:  When did this first appear?     X 5 years, since Tuesday looks like it is poking out more. She states it occurred after sneezing.  2. APPEARANCE: What does it look like?     A small bulge and it feels warm, the skin is sensitive to touch in the area.  3. SIZE: How big is it? (inches, cm or compare to coins, fruit)     About the size of a lemon.  4. LOCATION: Where exactly is the hernia located?     Lower abdomen on the right side.  5. PATTERN: Does the swelling come and go, or has it been constant since it started?     It goes away if she wears her brace and if she takes that off, it pops out again.  6. PAIN: Is there any pain? If Yes, ask: How bad is it?  (Scale 1-10; or mild, moderate, severe)    - MILD (1-3): Doesn't interfere with  normal activities, abdomen soft and not tender to touch.     - MODERATE (4-7): Interferes with normal activities or awakens from sleep, abdomen tender to touch.     - SEVERE (8-10): Excruciating pain, doubled over, unable to do any normal activities.       Pain right above the hernia on the left side of the belly button, if pressing or touching the pain goes up to an 8/10 and a 3-4/10 at rest.  7. DIAGNOSIS: Have you been seen by a doctor (or NP/PA) for this? Did the doctor diagnose you as having a hernia?     Yes, she states she has had it for 5 years. She was told in the past she was supposed to have surgery with a mesh implant to fix it but has not.  8. OTHER SYMPTOMS: Do you have any other symptoms? (e.g., fever, abdomen pain, vomiting)     Patient denies nausea at present (only nausea on Tuesday), vomiting, fever.  9. PREGNANCY: Is there any chance you are pregnant? When was your last menstrual period?     LMP: 3 days ago.  Protocols used: [facility]

## 2024-02-03 NOTE — Telephone Encounter (Signed)
 Noted

## 2024-08-24 ENCOUNTER — Ambulatory Visit: Payer: Self-pay

## 2024-08-24 NOTE — Telephone Encounter (Signed)
 Interpreter Jesus [phone] Pt to establish care, first available appt in March. Pt instructed to call back if anything changes or worsens before then.   FYI Only or Action Required?: FYI only for provider: appointment scheduled on 3/3.  Called Nurse Triage reporting Hernia.  Symptoms began several years ago.  Interventions attempted: Nothing.  Symptoms are: gradually worsening.  Triage Disposition: See PCP Within 2 Weeks  Patient/caregiver understands and will follow disposition?: Yes Copied from CRM [id number]. Topic: Clinical - Red Word Triage >> Aug 24, 2024  8:46 AM Wess RAMAN wrote: Red Word that prompted transfer to Nurse Triage: Patient has a hernia that is bothering her everytime she gets her period. Pain level 8  Would like new patient appt Reason for Disposition  Previously diagnosed hernia  Answer Assessment - Initial Assessment Questions 1. ONSET:  When did this first appear?     Hernia diagnosed six years ago  2. APPEARANCE: What does it look like?     A bulge  3. SIZE: How big is it? (e.g., inches, cm; or compare to coins, fruit)     Reports it has gotten bigger in the last 3 years, states the bulge is approximately the size of a baby's fist  4. LOCATION: Where exactly is the hernia located?     Under belly button  5. PATTERN: Does the swelling come and go, or has it been constant since it started?     Pt reports the pain comes when she is on her period, denies pain at this time  6. PAIN: Is there any pain? If Yes, ask: How bad is it?  (Scale 0-10; or none, mild, moderate, severe)     0/10 at this time, but pt is concerned that her last two periods have caused pain to her hernia site  7. DIAGNOSIS: Have you been seen by a doctor (or NP/PA) for this? Did the doctor diagnose you as having a hernia?     Yes, she was to have a mesh surgery  8. OTHER SYMPTOMS: Do you have any other symptoms? (e.g., fever, abdomen pain, vomiting)      Denies  9. PREGNANCY: Is there any chance you are pregnant? When was your last menstrual period?     LMP 2 weeks ago  Protocols used: Hernia-A-AH

## 2024-10-16 ENCOUNTER — Ambulatory Visit: Payer: Self-pay | Admitting: Urgent Care
